# Patient Record
Sex: Female | Born: 1935 | Race: White | Hispanic: No | Smoking: Former smoker
Health system: Southern US, Community
[De-identification: ages and names within clinical notes are randomized; demographics above are authoritative.]

## PROBLEM LIST (undated history)

## (undated) DIAGNOSIS — K219 Gastro-esophageal reflux disease without esophagitis: Secondary | ICD-10-CM

## (undated) DIAGNOSIS — I4891 Unspecified atrial fibrillation: Secondary | ICD-10-CM

## (undated) DIAGNOSIS — E785 Hyperlipidemia, unspecified: Secondary | ICD-10-CM

## (undated) DIAGNOSIS — K5792 Diverticulitis of intestine, part unspecified, without perforation or abscess without bleeding: Secondary | ICD-10-CM

## (undated) DIAGNOSIS — I10 Essential (primary) hypertension: Secondary | ICD-10-CM

## (undated) DIAGNOSIS — I639 Cerebral infarction, unspecified: Secondary | ICD-10-CM

## (undated) DIAGNOSIS — F0390 Unspecified dementia without behavioral disturbance: Secondary | ICD-10-CM

## (undated) HISTORY — PX: KNEE ARTHROPLASTY: SHX992

## (undated) HISTORY — PX: HEMORROIDECTOMY: SUR656

## (undated) HISTORY — PX: CARPAL TUNNEL RELEASE: SHX101

## (undated) HISTORY — PX: CATARACT EXTRACTION, BILATERAL: SHX1313

---

## 2011-08-26 DEATH — deceased

## 2018-04-17 ENCOUNTER — Observation Stay (HOSPITAL_BASED_OUTPATIENT_CLINIC_OR_DEPARTMENT_OTHER)
Admission: EM | Admit: 2018-04-17 | Discharge: 2018-04-19 | Disposition: A | Discharge status: Home / Self Care | Payer: Medicare Other | Source: Home / Self Care | Attending: Emergency Medicine | Admitting: Emergency Medicine

## 2018-04-17 ENCOUNTER — Other Ambulatory Visit: Payer: Self-pay

## 2018-04-17 DIAGNOSIS — Z79899 Other long term (current) drug therapy: Secondary | ICD-10-CM

## 2018-04-17 DIAGNOSIS — I342 Nonrheumatic mitral (valve) stenosis: Secondary | ICD-10-CM

## 2018-04-17 DIAGNOSIS — Z91040 Latex allergy status: Secondary | ICD-10-CM

## 2018-04-17 DIAGNOSIS — Z7982 Long term (current) use of aspirin: Secondary | ICD-10-CM

## 2018-04-17 DIAGNOSIS — E785 Hyperlipidemia, unspecified: Secondary | ICD-10-CM

## 2018-04-17 DIAGNOSIS — I10 Essential (primary) hypertension: Secondary | ICD-10-CM | POA: Insufficient documentation

## 2018-04-17 DIAGNOSIS — Z7902 Long term (current) use of antithrombotics/antiplatelets: Secondary | ICD-10-CM

## 2018-04-17 DIAGNOSIS — I63511 Cerebral infarction due to unspecified occlusion or stenosis of right middle cerebral artery: Secondary | ICD-10-CM

## 2018-04-17 DIAGNOSIS — I639 Cerebral infarction, unspecified: Secondary | ICD-10-CM | POA: Diagnosis present

## 2018-04-17 DIAGNOSIS — E7800 Pure hypercholesterolemia, unspecified: Secondary | ICD-10-CM

## 2018-04-17 DIAGNOSIS — I63411 Cerebral infarction due to embolism of right middle cerebral artery: Secondary | ICD-10-CM | POA: Diagnosis not present

## 2018-04-17 DIAGNOSIS — I4891 Unspecified atrial fibrillation: Secondary | ICD-10-CM | POA: Diagnosis not present

## 2018-04-17 DIAGNOSIS — R911 Solitary pulmonary nodule: Secondary | ICD-10-CM

## 2018-04-17 LAB — DIFFERENTIAL
ABS IMMATURE GRANULOCYTES: 0 10*3/uL (ref 0.0–0.1)
Basophils Absolute: 0 10*3/uL (ref 0.0–0.1)
Basophils Relative: 0 %
Eosinophils Absolute: 0.2 10*3/uL (ref 0.0–0.7)
Eosinophils Relative: 2 %
Immature Granulocytes: 0 %
LYMPHS ABS: 3.4 10*3/uL (ref 0.7–4.0)
LYMPHS PCT: 34 %
MONOS PCT: 9 %
Monocytes Absolute: 0.9 10*3/uL (ref 0.1–1.0)
NEUTROS ABS: 5.4 10*3/uL (ref 1.7–7.7)
Neutrophils Relative %: 55 %

## 2018-04-17 LAB — CBC
HEMATOCRIT: 39.1 % (ref 36.0–46.0)
HEMOGLOBIN: 12.5 g/dL (ref 12.0–15.0)
MCH: 29.4 pg (ref 26.0–34.0)
MCHC: 32 g/dL (ref 30.0–36.0)
MCV: 92 fL (ref 78.0–100.0)
PLATELETS: 207 10*3/uL (ref 150–400)
RBC: 4.25 MIL/uL (ref 3.87–5.11)
RDW: 12.6 % (ref 11.5–15.5)
WBC: 10 10*3/uL (ref 4.0–10.5)

## 2018-04-17 LAB — I-STAT CHEM 8, ED
BUN: 15 mg/dL (ref 8–23)
CHLORIDE: 107 mmol/L (ref 98–111)
CREATININE: 0.8 mg/dL (ref 0.44–1.00)
Calcium, Ion: 1.07 mmol/L — ABNORMAL LOW (ref 1.15–1.40)
GLUCOSE: 107 mg/dL — AB (ref 70–99)
HCT: 37 % (ref 36.0–46.0)
Hemoglobin: 12.6 g/dL (ref 12.0–15.0)
POTASSIUM: 4.2 mmol/L (ref 3.5–5.1)
Sodium: 139 mmol/L (ref 135–145)
TCO2: 24 mmol/L (ref 22–32)

## 2018-04-17 NOTE — ED Triage Notes (Signed)
Patient's daughter c/o her mother having "dizziness and stumbln', she has had the TIAs before".

## 2018-04-18 ENCOUNTER — Emergency Department (HOSPITAL_COMMUNITY): Payer: Medicare Other

## 2018-04-18 ENCOUNTER — Observation Stay (HOSPITAL_COMMUNITY): Payer: Medicare Other

## 2018-04-18 ENCOUNTER — Observation Stay (HOSPITAL_BASED_OUTPATIENT_CLINIC_OR_DEPARTMENT_OTHER): Payer: Medicare Other

## 2018-04-18 ENCOUNTER — Encounter (HOSPITAL_COMMUNITY): Payer: Self-pay

## 2018-04-18 DIAGNOSIS — I639 Cerebral infarction, unspecified: Secondary | ICD-10-CM | POA: Diagnosis present

## 2018-04-18 DIAGNOSIS — Z8249 Family history of ischemic heart disease and other diseases of the circulatory system: Secondary | ICD-10-CM

## 2018-04-18 DIAGNOSIS — Z66 Do not resuscitate: Secondary | ICD-10-CM

## 2018-04-18 DIAGNOSIS — I340 Nonrheumatic mitral (valve) insufficiency: Secondary | ICD-10-CM | POA: Diagnosis not present

## 2018-04-18 DIAGNOSIS — Z8673 Personal history of transient ischemic attack (TIA), and cerebral infarction without residual deficits: Secondary | ICD-10-CM

## 2018-04-18 DIAGNOSIS — I63511 Cerebral infarction due to unspecified occlusion or stenosis of right middle cerebral artery: Secondary | ICD-10-CM | POA: Diagnosis not present

## 2018-04-18 DIAGNOSIS — I10 Essential (primary) hypertension: Secondary | ICD-10-CM

## 2018-04-18 DIAGNOSIS — E7800 Pure hypercholesterolemia, unspecified: Secondary | ICD-10-CM

## 2018-04-18 DIAGNOSIS — E785 Hyperlipidemia, unspecified: Secondary | ICD-10-CM

## 2018-04-18 DIAGNOSIS — Z7982 Long term (current) use of aspirin: Secondary | ICD-10-CM

## 2018-04-18 DIAGNOSIS — Z823 Family history of stroke: Secondary | ICD-10-CM

## 2018-04-18 DIAGNOSIS — Z7902 Long term (current) use of antithrombotics/antiplatelets: Secondary | ICD-10-CM

## 2018-04-18 DIAGNOSIS — Z91040 Latex allergy status: Secondary | ICD-10-CM

## 2018-04-18 DIAGNOSIS — Z79899 Other long term (current) drug therapy: Secondary | ICD-10-CM

## 2018-04-18 LAB — CBC
HCT: 38.5 % (ref 36.0–46.0)
Hemoglobin: 12.6 g/dL (ref 12.0–15.0)
MCH: 30.3 pg (ref 26.0–34.0)
MCHC: 32.7 g/dL (ref 30.0–36.0)
MCV: 92.5 fL (ref 78.0–100.0)
PLATELETS: 199 10*3/uL (ref 150–400)
RBC: 4.16 MIL/uL (ref 3.87–5.11)
RDW: 12.8 % (ref 11.5–15.5)
WBC: 9.6 10*3/uL (ref 4.0–10.5)

## 2018-04-18 LAB — COMPREHENSIVE METABOLIC PANEL
ALT: 14 U/L (ref 0–44)
ANION GAP: 9 (ref 5–15)
AST: 21 U/L (ref 15–41)
Albumin: 3.5 g/dL (ref 3.5–5.0)
Alkaline Phosphatase: 52 U/L (ref 38–126)
BILIRUBIN TOTAL: 0.6 mg/dL (ref 0.3–1.2)
BUN: 13 mg/dL (ref 8–23)
CHLORIDE: 107 mmol/L (ref 98–111)
CO2: 24 mmol/L (ref 22–32)
Calcium: 9.1 mg/dL (ref 8.9–10.3)
Creatinine, Ser: 0.78 mg/dL (ref 0.44–1.00)
GFR calc Af Amer: >60 mL/min (ref >60)
GFR calc non Af Amer: >60 mL/min (ref >60)
GLUCOSE: 109 mg/dL — AB (ref 70–99)
POTASSIUM: 4.3 mmol/L (ref 3.5–5.1)
SODIUM: 140 mmol/L (ref 135–145)
TOTAL PROTEIN: 6.6 g/dL (ref 6.5–8.1)

## 2018-04-18 LAB — APTT: APTT: 32 s (ref 24–36)

## 2018-04-18 LAB — CREATININE, SERUM
Creatinine, Ser: 0.88 mg/dL (ref 0.44–1.00)
GFR calc Af Amer: >60 mL/min (ref >60)
GFR calc non Af Amer: 57 mL/min — ABNORMAL LOW (ref >60)

## 2018-04-18 LAB — ECHOCARDIOGRAM COMPLETE: WEIGHTICAEL: 2432 [oz_av]

## 2018-04-18 LAB — I-STAT TROPONIN, ED: Troponin i, poc: 0.02 ng/mL (ref 0.00–0.08)

## 2018-04-18 LAB — PROTIME-INR
INR: 1.1
PROTHROMBIN TIME: 14.2 s (ref 11.4–15.2)

## 2018-04-18 LAB — HEMOGLOBIN A1C
HEMOGLOBIN A1C: 5.8 % — AB (ref 4.8–5.6)
Mean Plasma Glucose: 119.76 mg/dL

## 2018-04-18 LAB — LIPID PANEL
CHOL/HDL RATIO: 5.1 ratio
Cholesterol: 244 mg/dL — ABNORMAL HIGH (ref 0–200)
HDL: 48 mg/dL (ref >40)
LDL CALC: 164 mg/dL — AB (ref 0–99)
Triglycerides: 161 mg/dL — ABNORMAL HIGH (ref <150)
VLDL: 32 mg/dL (ref 0–40)

## 2018-04-18 MED ORDER — ASPIRIN EC 81 MG PO TBEC: 81.0000 mg | DELAYED_RELEASE_TABLET | Freq: Every day | ORAL | Status: DC

## 2018-04-18 MED ORDER — ENOXAPARIN SODIUM 40 MG/0.4ML ~~LOC~~ SOLN
40.0000 mg | SUBCUTANEOUS | Status: DC
  Administered 2018-04-18 – 2018-04-19 (×2): 40 mg via SUBCUTANEOUS
  Filled 2018-04-18 (×3): qty 0.4

## 2018-04-18 MED ORDER — IOPAMIDOL (ISOVUE-370) INJECTION 76%
50.0000 mL | Freq: Once | INTRAVENOUS | Status: AC | PRN
  Administered 2018-04-18: 50 mL via INTRAVENOUS

## 2018-04-18 MED ORDER — ASPIRIN EC 325 MG PO TBEC
325.0000 mg | DELAYED_RELEASE_TABLET | Freq: Every day | ORAL | Status: DC
  Administered 2018-04-18 – 2018-04-19 (×2): 325 mg via ORAL
  Filled 2018-04-18 (×2): qty 1

## 2018-04-18 MED ORDER — SENNOSIDES-DOCUSATE SODIUM 8.6-50 MG PO TABS: 1.0000 | ORAL_TABLET | Freq: Every evening | ORAL | Status: DC | PRN

## 2018-04-18 MED ORDER — LACTATED RINGERS IV BOLUS
1000.0000 mL | Freq: Once | INTRAVENOUS | Status: AC
  Administered 2018-04-18: 1000 mL via INTRAVENOUS

## 2018-04-18 MED ORDER — FAMOTIDINE 20 MG PO TABS
20.0000 mg | ORAL_TABLET | Freq: Every day | ORAL | Status: DC
  Administered 2018-04-18 – 2018-04-19 (×2): 20 mg via ORAL
  Filled 2018-04-18 (×2): qty 1

## 2018-04-18 MED ORDER — ATORVASTATIN CALCIUM 40 MG PO TABS: 40.0000 mg | ORAL_TABLET | Freq: Every day | ORAL | Status: DC

## 2018-04-18 MED ORDER — CLOPIDOGREL BISULFATE 75 MG PO TABS
75.0000 mg | ORAL_TABLET | Freq: Every day | ORAL | Status: DC
  Administered 2018-04-18 – 2018-04-19 (×2): 75 mg via ORAL
  Filled 2018-04-18 (×2): qty 1

## 2018-04-18 MED ORDER — MECLIZINE HCL 12.5 MG PO TABS: 25.0000 mg | ORAL_TABLET | Freq: Three times a day (TID) | ORAL | Status: DC | PRN

## 2018-04-18 MED ORDER — ACETAMINOPHEN 325 MG PO TABS: 650.0000 mg | ORAL_TABLET | Freq: Four times a day (QID) | ORAL | Status: DC | PRN

## 2018-04-18 MED ORDER — ASPIRIN 81 MG PO CHEW
324.0000 mg | CHEWABLE_TABLET | Freq: Once | ORAL | Status: AC
  Administered 2018-04-18: 324 mg via ORAL
  Filled 2018-04-18: qty 4

## 2018-04-18 MED ORDER — ACETAMINOPHEN 650 MG RE SUPP: 650.0000 mg | Freq: Four times a day (QID) | RECTAL | Status: DC | PRN

## 2018-04-18 MED ORDER — EZETIMIBE 10 MG PO TABS
10.0000 mg | ORAL_TABLET | Freq: Every day | ORAL | Status: DC
  Administered 2018-04-18 – 2018-04-19 (×2): 10 mg via ORAL
  Filled 2018-04-18 (×2): qty 1

## 2018-04-18 NOTE — Evaluation (Signed)
Occupational Therapy Evaluation Patient Details Name: Katelyn Mckinney MRN: [id number] DOB: 1926-05-15 Today's Date: 04/18/2018    History of Present Illness Pt is an 84 y.o. female with PMH significant for previous TIAs, hypertension, hyperlipidemia, and vertigo who presented to the ED with dysarthria and dizziness with gait disturbance that resolved. CTA revealed R M1 near occlusion.   Clinical Impression   PTA, pt was living alone and was independent with basic ADL and majority of IADL tasks. Daughter checks in on pt regularly. Pt currently requiring min guard assist for toilet transfers and min assist for LB ADL. She demonstrates generalized weakness, slight confusion, and decreased problem solving skills. She was not oriented to the day of the week or why she is in the hospital. Pt would benefit from 24 hour assistance at home to maximize safety as well as home health OT follow-up post-acute D/C. Daughter reports that she will be able to provide temporary 24 hour assistance post-acute D/C.     Follow Up Recommendations  Home health OT;Supervision/Assistance - 24 hour    Equipment Recommendations  3 in 1 bedside commode    Recommendations for Other Services       Precautions / Restrictions Precautions Precautions: Fall Restrictions Weight Bearing Restrictions: No      Mobility Bed Mobility Overal bed mobility: Needs Assistance Bed Mobility: Supine to Sit     Supine to sit: Min assist     General bed mobility comments: Min assist at trunk to achieve seated position at EOB.   Transfers Overall transfer level: Needs assistance Equipment used: None Transfers: Sit to/from Stand Sit to Stand: Min guard         General transfer comment: Guarding assist to power up to standing.     Balance Overall balance assessment: Mild deficits observed, not formally tested                                         ADL either performed or assessed with clinical  judgement   ADL Overall ADL's : Needs assistance/impaired Eating/Feeding: Set up;Sitting   Grooming: Set up;Sitting   Upper Body Bathing: Set up;Sitting   Lower Body Bathing: Minimal assistance;Sit to/from stand   Upper Body Dressing : Set up;Sitting   Lower Body Dressing: Minimal assistance;Sit to/from stand   Toilet Transfer: Min guard;Ambulation   Toileting- Clothing Manipulation and Hygiene: Min guard;Sit to/from stand       Functional mobility during ADLs: Min guard General ADL Comments: Pt requiring cues for processing and sequencing. Min assist to address LB dressing and bathing tasks.      Vision Patient Visual Report: No change from baseline Vision Assessment?: No apparent visual deficits     Perception     Praxis      Pertinent Vitals/Pain Pain Assessment: No/denies pain     Hand Dominance Right   Extremity/Trunk Assessment Upper Extremity Assessment Upper Extremity Assessment: Generalized weakness   Lower Extremity Assessment Lower Extremity Assessment: Generalized weakness       Communication Communication Communication: No difficulties   Cognition Arousal/Alertness: Awake/alert Behavior During Therapy: WFL for tasks assessed/performed Overall Cognitive Status: Impaired/Different from baseline Area of Impairment: Orientation;Problem solving                 Orientation Level: Disoriented to;Situation           Problem Solving: Slow processing;Difficulty sequencing General Comments: Pt was  asleep on arrival and this may have had an influence on cognition. Pt's daughter reports that this is not typical.    General Comments  Daughter present at end of session.     Exercises     Shoulder Instructions      Home Living Family/patient expects to be discharged to:: Private residence Living Arrangements: Alone Available Help at Discharge: Family;Available 24 hours/day(initially 24 hours a day but then PRN) Type of Home: House Home  Access: Stairs to enter Entergy CorporationEntrance Stairs-Number of Steps: 2 Entrance Stairs-Rails: ("holds on to door frame" per daughter) Home Layout: One level     Bathroom Shower/Tub: Tub/shower unit(takes baths in tub)   Bathroom Toilet: Standard     Home Equipment: Grab bars - tub/shower          Prior Functioning/Environment Level of Independence: Independent        Comments: Independent for basic ADL, medication management, and majority of financial management.         OT Problem List: Decreased strength;Decreased activity tolerance;Decreased range of motion;Impaired balance (sitting and/or standing);Decreased safety awareness;Decreased knowledge of use of DME or AE;Decreased knowledge of precautions;Pain      OT Treatment/Interventions: Self-care/ADL training;Therapeutic exercise;Energy conservation;DME and/or AE instruction;Therapeutic activities;Patient/family education;Balance training    OT Goals(Current goals can be found in the care plan section) Acute Rehab OT Goals Patient Stated Goal: "to get a blanket" OT Goal Formulation: With patient/family Time For Goal Achievement: 05/02/18 Potential to Achieve Goals: Good ADL Goals Pt Will Perform Grooming: with modified independence;standing Pt Will Perform Upper Body Dressing: with modified independence;sitting Pt Will Perform Lower Body Dressing: with modified independence;sit to/from stand Pt Will Transfer to Toilet: with modified independence;ambulating;regular height toilet Pt Will Perform Toileting - Clothing Manipulation and hygiene: with modified independence;sit to/from stand Pt Will Perform Tub/Shower Transfer: Tub transfer;ambulating;3 in 1;grab bars  OT Frequency: Min 2X/week   Barriers to D/C:            Co-evaluation              AM-PAC PT "6 Clicks" Daily Activity     Outcome Measure Help from another person eating meals?: None Help from another person taking care of personal grooming?: None Help from  another person toileting, which includes using toliet, bedpan, or urinal?: A Little Help from another person bathing (including washing, rinsing, drying)?: A Little Help from another person to put on and taking off regular upper body clothing?: None Help from another person to put on and taking off regular lower body clothing?: A Little 6 Click Score: 21   End of Session Equipment Utilized During Treatment: Gait belt Nurse Communication: Mobility status  Activity Tolerance: Patient tolerated treatment well Patient left: in chair;with call bell/phone within reach;with chair alarm set  OT Visit Diagnosis: Other abnormalities of gait and mobility (R26.89);Other symptoms and signs involving cognitive function;Muscle weakness (generalized) (M62.81)                Time: [id number] OT Time Calculation (min): 25 min Charges:  OT General Charges $OT Visit: 1 Visit OT Evaluation $OT Eval Low Complexity: 1 Low OT Treatments $Self Care/Home Management : 8-22 mins  Doristine Sectionharity A Bergen Melle, MS OTR/L  Pager: [phone]   Ariah Mower A Kellyanne Ellwanger 04/18/2018, 8:41 AM

## 2018-04-18 NOTE — Progress Notes (Signed)
  Echocardiogram 2D Echocardiogram has been performed.  Katelyn SavoyCasey N Albion Mckinney 04/18/2018, 2:40 PM

## 2018-04-18 NOTE — ED Notes (Signed)
Ambulated in hall.  Steady gait noted.  Tolerated well.

## 2018-04-18 NOTE — Progress Notes (Signed)
SLP Cancellation Note  Patient Details Name: Katelyn Mckinney MRN: [id number] DOB: 07-Jul-1926   Cancelled treatment:       Reason Eval/Treat Not Completed: Other (comment) pt passed RN stroke swallow screen; per protocol, no SLP swallow eval warranted.  Our services will sign off.    Blenda MountsCouture, Hank Walling Laurice 04/18/2018, 11:01 AM

## 2018-04-18 NOTE — Consult Note (Signed)
Neurology Consultation Reason for Consult: Unsteady gait Referring Physician: Rhunette CroftNanavati, A  CC: Slurred speech  History is obtained from: patient, family  HPI: Katelyn Mckinney is a 84 y.o. female with a history of hypertension, hyperlipidemia presents with unsteady gait dysarthria for the past couple of days.  Her daughter states that on Saturday she began slurring her words and complaining of difficulty walking.  The patient states that she just felt unsteady.  Of note, she has had TIAs in the past and is currently taking Plavix.  She has been on statins in the past, but due to intolerance is not been able to take them.  LKW: Saturday tpa given?: no, outside of window    ROS: A 14 point ROS was performed and is negative except as noted in the HPI.   Past medical history:  Hypertension Hyperlipidemia TIAs Statin intolerance  Family history: Daughter with statin intolerance  Social History: Does not smoke   Exam: Current vital signs: BP (!) 118/97   Pulse 63   Temp 98.4 F (36.9 C)   Resp 16   Wt 68.9 kg   SpO2 100%  Vital signs in last 24 hours: Temp:  [98.4 F (36.9 C)-98.5 F (36.9 C)] 98.4 F (36.9 C) (08/25 0051) Pulse Rate:  [56-75] 63 (08/25 0300) Resp:  [16-23] 16 (08/25 0300) BP: (115-166)/(56-97) 118/97 (08/25 0300) SpO2:  [95 %-100 %] 100 % (08/25 0300) Weight:  [68.9 kg] 68.9 kg (08/24 2343)   Physical Exam  Constitutional: Appears well-developed and well-nourished.  Psych: Affect appropriate to situation Eyes: No scleral injection HENT: No OP obstrucion Head: Normocephalic.  Cardiovascular: Normal rate and regular rhythm.  Respiratory: Effort normal, non-labored breathing GI: Soft.  No distension. There is no tenderness.  Skin: WDI  Neuro: Mental Status: Patient is awake, alert, oriented to person, place, month, year, and situation. Patient is able to give a clear and coherent history. No signs of aphasia  She has mild visual neglect,  when double simultaneous stimulation is presented in the visual field she only counts fingers on the right Cranial Nerves: II: Visual Fields are full. Pupils are equal, round, and reactive to light.   III,IV, VI: EOMI without ptosis or diploplia.  V: Facial sensation is symmetric to temperature VII: Facial movement is symmetric.  VIII: hearing is intact to voice X: Uvula elevates symmetrically XI: Shoulder shrug is symmetric. XII: tongue is midline without atrophy or fasciculations.  Motor: Tone is normal. Bulk is normal. 5/5 strength was present in all four extremities.  Sensory: Sensation is symmetric to light touch and temperature in the arms and legs.  She does not extinguish to double simultaneous stimulation sensory stimuli. Cerebellar: No clear ataxia but she does have intentional tremor on finger-nose-finger bilaterally  I have reviewed labs in epic and the results pertinent to this consultation are: CMP-unremarkable  I have reviewed the images obtained: CT head- no clear stroke, CTA severe M1 stenosis on the right  Impression: 84 year old female with slurred speech and unsteady gait, mild visual neglect with severe right M1 stenosis.  I suspect that she has had a small ischemic infarct that is not showing up well on plain CT.  She will need to be admitted for secondary risk factor modification and physical therapy.  Recommendations: - HgbA1c, fasting lipid panel - MRI of the brain without contrast - Frequent neuro checks - Echocardiogram - Carotid dopplers are not needed given CTA artery performed - Prophylactic therapy-Antiplatelet med: Plavix - Risk factor modification -  Telemetry monitoring - PT consult, OT consult, Speech consult - Stroke team to follow    Roland Rack, MD [facility] [phone]  If 7pm- 7am, please page neurology on call as listed in [place].

## 2018-04-18 NOTE — ED Notes (Signed)
Taken to ct at this time. 

## 2018-04-18 NOTE — Progress Notes (Signed)
Pt admitted to unit; ambulated to bed from stretcher. Welcomed and oriented to unit. No c/o pain or discomfort.

## 2018-04-18 NOTE — Progress Notes (Signed)
   Subjective: Patient is feeling well this AM and in her opinion feels back to baseline. She did walk a little this AM and felt weak in her LEs towards the end of ambulation. No issues tolerating PO intake. Per daughter at bedside she feels that her mother is no longer slurring her words but still seems to be confused. We discussed the plan for continued TIA vs CVA work-up including MRI and echocardiogram. Patient and daughter agree. All questions and concerns addressed.   Objective: Vital signs in last 24 hours: Vitals:   04/18/18 0500 04/18/18 0558 04/18/18 0625 04/18/18 0829  BP: (!) 152/77  (!) 171/65 (!) 147/66  Pulse: 75  74 68  Resp: 15  16 16   Temp:  98.5 F (36.9 C) 97.9 F (36.6 C) 98.5 F (36.9 C)  TempSrc:  Oral Oral Oral  SpO2: 99%  98% 98%  Weight:       General: Well nourished female in no acute distress Pulm: Good air movement with no wheezing or crackles  CV: RRR, no murmurs, no rubs  Abdomen: Active bowel sounds, soft, non-distended, no tenderness to palpation  Neuro: Alert. Oriented to person and place but not time. Cranial nerves intact bilaterally. Gross strength 5/5 in the upper and lower extremities bilaterally.   Assessment/Plan:  [name] is a 84 y.o female with prior CVA, HTN, and HLD who presented with <24 hours of slurred speech, confusion, and gait ataxia. She is admitted for continued CVA vs TIA work-up.   CVA vs TIA - Patient still slightly confused but no longer slurring her words. No other neuro deficits noted.  - CT without any acute ischemia. CTA with right M1 segment near occlusion. - LDL 164, not on statin because she was intolerant in the past. Will start Ezetimibe 10 mg QD - A1c 5.8 % in the pre-diabetic range, outpatient follow-up  - Telemetry reviewed. Sinus without any atrial fibrillation or flutter  - Continue ASA and clopidogrel  - Allow permissive HTN  - MRI ordered/pending  - Echocardiogram ordered/pending  - PT eval pending. OT  recommending home health OT and 3-in-1 bedside commode  - Appreciate neurology recs.   Hypertension  - Allowing permissive HTN in the setting of possible CVA - On Atenolol 100 mg QD at home  Hyperlipidemia  - LDL 162 - Statin intolerant. Started on Ezetimibe 10 mg QD  RUL Pulmonary Nodule  - CTA head and neck with incidental 4 mm RUL nodule  - Former social smoker  - Outpatient follow-up with her PCP  Dispo: Anticipated discharge in 0-1 days depending on conclusion of CVA/TIA work-up and neurology recommendations.   Levora DredgeHelberg, Manasvini Whatley, MD 04/18/2018, 9:43 AM Pager: [phone]

## 2018-04-18 NOTE — Progress Notes (Signed)
STROKE TEAM PROGRESS NOTE   SUBJECTIVE (INTERVAL HISTORY) Her daughter and son-in-law is at the bedside.  Patient is sitting in bed without acute abnormality.  Patient daughter said patient speech and generalized weakness much improved from yesterday.  Neuro examination nonfocal.    OBJECTIVE Vitals:   04/18/18 0558 04/18/18 0625 04/18/18 0829 04/18/18 1527  BP:  (!) 171/65 (!) 147/66 (!) 146/113  Pulse:  74 68   Resp:  16 16 19   Temp: 98.5 F (36.9 C) 97.9 F (36.6 C) 98.5 F (36.9 C) 98.2 F (36.8 C)  TempSrc: Oral Oral Oral Oral  SpO2:  98% 98% 98%  Weight:        CBC:  Recent Labs  Lab 04/17/18 2348 04/17/18 2354 04/18/18 0522  WBC 10.0  --  9.6  NEUTROABS 5.4  --   --   HGB 12.5 12.6 12.6  HCT 39.1 37.0 38.5  MCV 92.0  --  92.5  PLT 207  --  199    Basic Metabolic Panel:  Recent Labs  Lab 04/17/18 2348 04/17/18 2354 04/18/18 0522  NA 140 139  --   K 4.3 4.2  --   CL 107 107  --   CO2 24  --   --   GLUCOSE 109* 107*  --   BUN 13 15  --   CREATININE 0.78 0.80 0.88  CALCIUM 9.1  --   --     Lipid Panel:     Component Value Date/Time   CHOL 244 (H) 04/18/2018 0522   TRIG 161 (H) 04/18/2018 0522   HDL 48 04/18/2018 0522   CHOLHDL 5.1 04/18/2018 0522   VLDL 32 04/18/2018 0522   LDLCALC 164 (H) 04/18/2018 0522   HgbA1c:  Lab Results  Component Value Date   HGBA1C 5.8 (H) 04/18/2018   Urine Drug Screen: No results found for: LABOPIA, COCAINSCRNUR, LABBENZ, AMPHETMU, THCU, LABBARB  Alcohol Level No results found for: ETH  IMAGING  Ct Angio Head W Or Wo Contrast Ct Angio Neck W And/or Wo Contrast 04/18/2018 IMPRESSION:  1. Right M1 6 mm length segment of near occlusion, severe thread-like stenosis. Diminished downstream right MCA distribution in comparison with the left.  CT perfusion of the head may be useful to assess for oligemia.  2. Calcific atherosclerosis of the aorta, carotid arteries, and vertebral arteries. Multiple segments of mild  stenosis in the anterior and posterior circulations.  3. No additional large vessel occlusion, hemodynamically significant stenosis by NASCET criteria, dissection, or vascular malformation.  4. 4 mm right upper lobe pulmonary nodule. No follow-up needed if patient is low-risk. Non-contrast chest CT can be considered in 12 months if patient is high-risk. This recommendation follows the consensus statement: Guidelines for Management of Incidental Pulmonary Nodules Detected on CT Images: From the Fleischner Society 2017; Radiology 2017; 284:228-243.  5. Trace bilateral pleural effusions and mild interstitial pulmonary edema.   Ct Head Wo Contrast 04/18/2018 IMPRESSION:  1. No acute intracranial abnormality identified.  2. Mild chronic microvascular ischemic changes and volume loss of the brain for age. Small chronic infarct within the left anterior thalamus.   MRI Brain Wo Contrast - pending   TTE  - Left ventricle: The cavity size was normal. Wall thickness was   normal. Systolic function was normal. The estimated ejection   fraction was in the range of 60% to 65%. The study is not   technically sufficient to allow evaluation of LV diastolic   function. - Aortic valve: Sclerosis  without stenosis. - Mitral valve: Severely calcified annulus. Moderately thickened,   moderately calcified leaflets . The findings are consistent with   mild stenosis. There was mild regurgitation. Valve area by   continuity equation (using LVOT flow): 1 cm^2. - Left atrium: The atrium was moderately to severely dilated. - Atrial septum: No defect or patent foramen ovale was identified. - Pulmonary arteries: PA peak pressure: 43 mm Hg (S).  Bilateral Lower Extremity Dopplers Right: There is no evidence of deep vein thrombosis in the lower extremity.There is no evidence of superficial venous thrombosis. Left: There is no evidence of deep vein thrombosis in the lower extremity.There is no evidence of superficial  venous thrombosis.     PHYSICAL EXAM Temp:  [97.9 F (36.6 C)-98.5 F (36.9 C)] 98.2 F (36.8 C) (08/25 1527) Pulse Rate:  [56-75] 68 (08/25 0829) Resp:  [12-23] 19 (08/25 1527) BP: (115-171)/(52-113) 146/113 (08/25 1527) SpO2:  [95 %-100 %] 98 % (08/25 1527) Weight:  [68.9 kg] 68.9 kg (08/25 1240)  General - Well nourished, well developed, in no apparent distress.  Ophthalmologic - fundi not visualized due to noncooperation.  Cardiovascular - Regular rate and rhythm with no murmur.  Mental Status -  Level of arousal and orientation to place, and person were intact, but incorrect on month and year. Language including expression, naming, repetition, comprehension was assessed and found intact.  Cranial Nerves II - XII - II - Visual field intact OU. III, IV, VI - Extraocular movements intact. V - Facial sensation intact bilaterally. VII - Facial movement intact bilaterally. VIII - Hearing & vestibular intact bilaterally. X - Palate elevates symmetrically. XI - Chin turning & shoulder shrug intact bilaterally. XII - Tongue protrusion intact.  Motor Strength - The patient's strength was symmetrical in all extremities and pronator drift was absent.  Bulk was normal and fasciculations were absent.   Motor Tone - Muscle tone was assessed at the neck and appendages and was normal.  Reflexes - The patient's reflexes were symmetrical in all extremities and she had no pathological reflexes.  Sensory - Light touch, temperature/pinprick were assessed and were symmetrical.    Coordination - The patient had normal movements in the hands with no ataxia or dysmetria. Mild action tremor was present b/l UEs.  Gait and Station - deferred.   ASSESSMENT/PLAN [name] is a 84 y.o. female with history of TIAs, hypertension, and hyperlipidemia presenting with speech and gait difficulties. She did not receive IV t-PA due to late presentation  Stroke vs. TIA:  MRI pending - may  related to right M1 high grade stenosis  CT head - No acute intracranial abnormality identified.   MRI head - pending  CTA H&N - Right M1 6 mm length segment of near occlusion, severe thread-like stenosis.  Aortic atherosclerosis, right VA origin stenosis and left ICA 50% proximal stenosis.  B LE Dopplers no DVT  2D Echo EF 60 to 65%  LDL - 164  HgbA1c - 5.8  VTE prophylaxis - Lovenox  aspirin 81 mg daily and clopidogrel 75 mg daily prior to admission, now on aspirin 325 mg daily and clopidogrel 75 mg daily due to intracranial stenosis.  Recommend DAPT for 3 months and then Plavix alone.  Patient counseled to be compliant with her antithrombotic medications  Ongoing aggressive stroke risk factor management  Therapy recommendations:  HH PT and OT recommended.  Disposition:  Pending  Hypertension  Stable . Permissive hypertension (OK if < 220/120) but gradually normalize  in 5-7 days . Long-term BP goal normotensive  Hyperlipidemia  Lipid lowering medication PTA:  Statin intolerance  LDL 164, goal < 70  Current lipid lowering medication: Zetia 10 mg daily  Continue statin at discharge  Consider PCSK9 inhibitors as outpatient  Other Stroke Risk Factors  Advanced age  Obesity  Hx of "TIAs" with episode of generalized weakness and near syncope  Family history of stroke - per daughter's report  Other Active Problems  4 mm right upper lobe pulmonary nodule on CTA.  Statin intolerance   Hospital day # 0  Marvel Plan, MD PhD Stroke Neurology 04/18/2018 6:14 PM    To contact Stroke Continuity provider, please refer to [email or web address]. After hours, contact General Neurology

## 2018-04-18 NOTE — ED Notes (Signed)
Pt ambulated to bathroom with minimal assistance. Slight wobbliness noted but pt ambulated independently most of the walk.

## 2018-04-18 NOTE — H&P (Addendum)
Date: 04/18/2018               Patient Name:  TAYIA STONESIFER MRN: [id number]  DOB: 09-17-1925 Age / Sex: 84 y.o., female   PCP: Aniceto Boss, MD         Medical Service: Internal Medicine Teaching Service         Attending Physician: Dr. Earl Lagos, MD    First Contact: Dr. Minus Liberty Pager: [phone]  Second Contact: Dr. Caron Presume Pager: [phone]       After Hours (After 5p/  First Contact Pager: [phone]  weekends / holidays): Second Contact Pager: [phone]   Chief Complaint: Dizziness  History of Present Illness:   Ms. Sabree is an 84 year old woman with medical history significant for previous TIAs, hypertension, hyperlipidemia and vertigo who presented to the ED with dysarthria and dizziness which had resolved on arrival to the ED.  Per her daughter and brother-in-law, she was in her usual state of health until Saturday evening around 7 p.m.  The daughter was on the phone with mom and had noticed her mom was having slurred speech and was confused as she had asked about going to church.  After paying visit to see her mom, she realized that she had an unsteady gait. On speaking to the patient, she reported of generalized weakness for 1 week and lightheadedness with standing but denies loss of consciousness, falls (however daughter reported she had complained of falls previously), syncopal episodes, palpitation, chest pain, shortness of breath, headaches, head trauma. Currently, she complains of a 9/10 ache at the back of her neck which she indicates as   chronic and typically relieved with Tylenol.  She is very active, lives by herself in [place], able to complete all her ADLs and has frequent visits from her family.    She reports of having 3-4 TIAs in the past couple years and has been evaluated at the hospital in [place].  She also experiences occasional vertigo with sensation of the room spinning especially with head movement but this recent dizziness did not resemble her  usual vertigo spells.  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  She received a one-time dose of aspirin 324 mg and 1 L bolus of lactated Ringer's.  Neurology and internal medicine team were consulted for further evaluation.  Meds:  Current Meds  Medication Sig  . aspirin EC 81 MG tablet Take 81 mg by mouth daily.  Marland Kitchen atenolol (TENORMIN) 50 MG tablet Take 100 mg by mouth daily.  . clopidogrel (PLAVIX) 75 MG tablet Take 75 mg by mouth daily.  Marland Kitchen LORazepam (ATIVAN) 0.5 MG tablet Take 0.5 mg by mouth at bedtime.  . meclizine (ANTIVERT) 25 MG tablet Take 25 mg by mouth 3 (three) times daily as needed for dizziness.   . ranitidine (ZANTAC) 75 MG tablet Take 75 mg by mouth daily as needed for heartburn.     Allergies: Allergies as of 04/17/2018 - Review Complete 04/17/2018  Allergen Reaction Noted  . Latex  04/17/2018   No past medical history on file.  Family History:  -Hypertension and stroke - Daughter reports allergy/intolerance to  statin  Social History:  - Denies current cigarette, EtOH, illicit drug use   Review of Systems: A complete ROS was negative except as per HPI.   Physical Exam: Blood pressure 118/71, pulse 69, temperature 98.4 F (36.9 C), resp. rate (!) 21, weight 68.9 kg, SpO2 95 %.  Physical Exam  Constitutional: She is well-developed, well-nourished, and in no distress.  HENT:  Head: Normocephalic and atraumatic.    Eyes: Pupils are equal, round, and reactive to light. EOM are normal.  Neck: Normal range of motion. Neck supple.  Cardiovascular: Normal  rate, regular rhythm, normal heart sounds and intact distal pulses. Exam reveals no gallop and no friction rub.  No murmur heard. Pulmonary/Chest: Effort normal and breath sounds normal. No respiratory distress. She has no wheezes. She has no rales.  Abdominal: Soft. Bowel sounds are normal. She exhibits no distension. There is no tenderness.  Neurological: She is alert. She has normal motor skills. No cranial nerve deficit.  Alert and oriented to only name and place CN II-XII intact Cerebella: No dysdiadochokinesia  Skin:     Psychiatric: Mood and affect normal.    EKG: personally reviewed my interpretation is unremarkable EKG   Assessment & Plan by Problem: Active Problems:   TIA (transient ischemic attack)  Ms. Suzette BattiestBowling is an 84 year old woman with medical history significant for previous TIAs, hypertension, hyperlipidemia and vertigo who presented to the ED with less than 24-hour history of dysarthria and dizziness currently being managed for TIA.  Transient ischemic attack: Patient with <24hr with history of dysarthria, dizziness. CT head showed no acute intracranial abnormality.  CT Angio showed right M1 segment near occlusion.  No neurological deficits on physical exam but found to be only alert and oriented to name and place.  Her risk factors for TIA and cerebrovascular accident include personal history of TIAs, hypertension, hyperlipidemia.  Currently not on statin therapy due to intolerance, reports of having swelling and weakness while on statin.  Please appreciate neurology recs: -Follow-up hemoglobin A1c, fasting lipid panel, MRI brain without contrast, echocardiogram -Telemetry monitoring -Frequent neurochecks -Continue Plavix 75mg , ASA 81mg   -PT/OT/speech evaluation consult  Hypertension: Current BP of 118/71.  Takes atenolol 100 mg daily at home. -Hold antihypertensives to allow cerebral perfusion  Hyperlipidemia: Reported history of statin intolerance, unable to  calculate ASCVD score at this point as we do not have lipid panel on file.  - Continue aspirin 81 mg daily, Plavix 25 mg daily  Vertigo: We will continue home meclizine 25 mg 3 times daily prn dizziness   FEN: No fluids, replace electrolytes as needed, n.p.o. VTE prophylaxis: Lovenox 40 mg SQ CODE STATUS: DNR  Dispo: Admit patient to observation with expected length of stay less than 2 midnights    Signed: Yvette RackAgyei, Treesa Mccully K, MD 04/18/2018, 5:26 AM  Pager: [phone] [facility] PGY-1

## 2018-04-18 NOTE — Evaluation (Signed)
Physical Therapy Evaluation Patient Details Name: Katelyn Mckinney MRN: [id number] DOB: 06/22/1926 Today's Date: 04/18/2018   History of Present Illness  Pt is an 84 y.o. female with PMH significant for previous TIAs, hypertension, hyperlipidemia, and vertigo who presented to the ED with dysarthria and dizziness with gait disturbance that resolved. CTA revealed R M1 near occlusion.  Clinical Impression  Orders received for PT evaluation. Patient demonstrates deficits in functional mobility as indicated below. Will benefit from continued skilled PT to address deficits and maximize function. Will see as indicated and progress as tolerated.  OF note; Patient does endorse dizziness with activity which resolves quickly and pain in posterior neck.    Follow Up Recommendations Home health PT;Supervision/Assistance - 24 hour    Equipment Recommendations  None recommended by PT    Recommendations for Other Services       Precautions / Restrictions Precautions Precautions: Fall Restrictions Weight Bearing Restrictions: No      Mobility  Bed Mobility Overal bed mobility: Needs Assistance Bed Mobility: Supine to Sit     Supine to sit: Min assist     General bed mobility comments: received in chair   Transfers Overall transfer level: Needs assistance Equipment used: None Transfers: Sit to/from Stand Sit to Stand: Min guard         General transfer comment: Guarding assist to power up to standing.   Ambulation/Gait Ambulation/Gait assistance: Min guard Gait Distance (Feet): 160 Feet Assistive device: None Gait Pattern/deviations: Step-through pattern;Decreased stride length;Shuffle Gait velocity: decreased Gait velocity interpretation: <1.8 ft/sec, indicate of risk for recurrent falls General Gait Details: patient reports bilateral LE tightness and generalized weakness/fatigue with mobility. some instability noted but no physical assist required  Stairs             Wheelchair Mobility    Modified Rankin (Stroke Patients Only) Modified Rankin (Stroke Patients Only) Pre-Morbid Rankin Score: No symptoms Modified Rankin: Moderate disability     Balance Overall balance assessment: Mild deficits observed, not formally tested                                           Pertinent Vitals/Pain Pain Assessment: Faces Faces Pain Scale: Hurts even more Pain Location: neck Pain Descriptors / Indicators: Aching Pain Intervention(s): Monitored during session    Home Living Family/patient expects to be discharged to:: Private residence Living Arrangements: Alone Available Help at Discharge: Family;Available 24 hours/day(initially 24 hours a day but then PRN) Type of Home: House Home Access: Stairs to enter Entrance Stairs-Rails: ("holds on to door frame" per daughter) Secretary/administratorntrance Stairs-Number of Steps: 2 Home Layout: One level Home Equipment: Grab bars - tub/shower      Prior Function Level of Independence: Independent         Comments: Independent for basic ADL, medication management, and majority of financial management.      Hand Dominance   Dominant Hand: Right    Extremity/Trunk Assessment   Upper Extremity Assessment Upper Extremity Assessment: Generalized weakness    Lower Extremity Assessment Lower Extremity Assessment: Generalized weakness       Communication   Communication: No difficulties  Cognition Arousal/Alertness: Awake/alert Behavior During Therapy: WFL for tasks assessed/performed Overall Cognitive Status: Impaired/Different from baseline Area of Impairment: Attention;Memory;Problem solving                 Orientation Level: Disoriented to;Situation Current Attention Level:  Sustained Memory: Decreased short-term memory       Problem Solving: Slow processing;Difficulty sequencing General Comments: Pt was asleep on arrival and this may have had an influence on cognition. Pt's  daughter reports that this is not typical.       General Comments General comments (skin integrity, edema, etc.): daughter at bedside during session    Exercises     Assessment/Plan    PT Assessment Patient needs continued PT services  PT Problem List Decreased strength;Decreased activity tolerance;Decreased balance;Decreased mobility;Pain;Decreased cognition       PT Treatment Interventions DME instruction;Gait training;Stair training;Functional mobility training;Therapeutic activities;Therapeutic exercise;Neuromuscular re-education;Balance training;Patient/family education    PT Goals (Current goals can be found in the Care Plan section)  Acute Rehab PT Goals Patient Stated Goal: to take a nap PT Goal Formulation: With patient/family Time For Goal Achievement: 05/02/18 Potential to Achieve Goals: Good    Frequency Min 3X/week   Barriers to discharge        Co-evaluation               AM-PAC PT "6 Clicks" Daily Activity  Outcome Measure Difficulty turning over in bed (including adjusting bedclothes, sheets and blankets)?: A Little Difficulty moving from lying on back to sitting on the side of the bed? : A Little Difficulty sitting down on and standing up from a chair with arms (e.g., wheelchair, bedside commode, etc,.)?: A Little Help needed moving to and from a bed to chair (including a wheelchair)?: A Little Help needed walking in hospital room?: A Little Help needed climbing 3-5 steps with a railing? : A Little 6 Click Score: 18    End of Session Equipment Utilized During Treatment: Gait belt Activity Tolerance: Patient tolerated treatment well Patient left: in chair;with call bell/phone within reach;with chair alarm set;with family/visitor present Nurse Communication: Mobility status PT Visit Diagnosis: Unsteadiness on feet (R26.81)    Time: 1610-9604 PT Time Calculation (min) (ACUTE ONLY): 20 min   Charges:   PT Evaluation $PT Eval Moderate  Complexity: 1 Mod          Charlotte Crumb, PT DPT  Board Certified Neurologic Specialist [phone]   Fabio Asa 04/18/2018, 9:46 AM

## 2018-04-18 NOTE — ED Provider Notes (Signed)
[facility] EMERGENCY DEPARTMENT Provider Note   CSN: [id number] Arrival date & time: 04/17/18  2330     History   Chief Complaint Chief Complaint  Patient presents with  . Dizziness    HPI Katelyn Mckinney is a 84 y.o. female.  HPI   84 year old female with history of TIAs comes in with chief complaint of dizziness. According to patient's daughter, patient was last seen normal on Friday.  Saturday when she called her daughter, she noted that patient was sounding a bit confused and had slight slurring of her speech.  When she went to assess her mother, she noted that patient was dizzy and unsteady with her walker.  Patient normally walks without a walker and lives independently.  Patient complains of dull pain to the back of her neck. She denies any numbness, tingling, focal weakness.    No past medical history on file.  There are no active problems to display for this patient.   OB History   None      Home Medications    Prior to Admission medications   Medication Sig Start Date End Date Taking? Authorizing Provider  aspirin EC 81 MG tablet Take 81 mg by mouth daily.   Yes [provider]  atenolol (TENORMIN) 50 MG tablet Take 100 mg by mouth daily. 02/16/18  Yes [provider]  clopidogrel (PLAVIX) 75 MG tablet Take 75 mg by mouth daily. 03/07/18  Yes [provider]  LORazepam (ATIVAN) 0.5 MG tablet Take 0.5 mg by mouth at bedtime. 02/22/18  Yes [provider]  meclizine (ANTIVERT) 25 MG tablet Take 25 mg by mouth 3 (three) times daily as needed for dizziness.  01/16/18  Yes [provider]  ranitidine (ZANTAC) 75 MG tablet Take 75 mg by mouth daily as needed for heartburn.   Yes [provider]    Family History No family history on file.  Social History Social History   Tobacco Use  . Smoking status: Not on file  Substance Use Topics  . Alcohol use: Not on file  . Drug use: Not on file       Allergies   Latex   Review of Systems Review of Systems  Constitutional: Positive for activity change.  Respiratory: Negative for shortness of breath.   Cardiovascular: Negative for chest pain.  Gastrointestinal: Negative for nausea and vomiting.  Neurological: Positive for dizziness and headaches.  All other systems reviewed and are negative.    Physical Exam Updated Vital Signs BP (!) 118/97   Pulse 63   Temp 98.4 F (36.9 C)   Resp 16   Wt 68.9 kg   SpO2 100%   Physical Exam  Constitutional: She is oriented to person, place, and time. She appears well-developed.  HENT:  Head: Normocephalic and atraumatic.  Eyes: Pupils are equal, round, and reactive to light.  Saccadic eye movement noted during evaluation of gaze, however there is no pupillary lag appreciated  Neck: Normal range of motion. Neck supple.  Cardiovascular: Normal rate.  Pulmonary/Chest: Effort normal.  Abdominal: Bowel sounds are normal.  Neurological: She is alert and oriented to person, place, and time. No cranial nerve deficit or sensory deficit. Coordination normal.  No dysmetria  Skin: Skin is warm and dry.  Nursing note and vitals reviewed.    ED Treatments / Results  Labs (all labs ordered are listed, but only abnormal results are displayed) Labs Reviewed  COMPREHENSIVE METABOLIC PANEL - Abnormal; Notable for the  following components:      Result Value   Glucose, Bld 109 (*)    All other components within normal limits  I-STAT CHEM 8, ED - Abnormal; Notable for the following components:   Glucose, Bld 107 (*)    Calcium, Ion 1.07 (*)    All other components within normal limits  PROTIME-INR  APTT  CBC  DIFFERENTIAL  I-STAT TROPONIN, ED  CBG MONITORING, ED    EKG None  Radiology Ct Angio Head W Or Wo Contrast  Result Date: 04/18/2018 CLINICAL DATA:  84 y/o  F; ataxia, stroke suspected. EXAM: CT ANGIOGRAPHY HEAD AND NECK TECHNIQUE: Multidetector CT imaging of the head  and neck was performed using the standard protocol during bolus administration of intravenous contrast. Multiplanar CT image reconstructions and MIPs were obtained to evaluate the vascular anatomy. Carotid stenosis measurements (when applicable) are obtained utilizing NASCET criteria, using the distal internal carotid diameter as the denominator. CONTRAST:  50mL ISOVUE-370 IOPAMIDOL (ISOVUE-370) INJECTION 76% COMPARISON:  04/18/2018 CT head. FINDINGS: CTA NECK FINDINGS Aortic arch: Standard branching. Imaged portion shows no evidence of aneurysm or dissection. No significant stenosis of the major arch vessel origins. Severe calcific atherosclerosis. Right carotid system: No evidence of dissection, stenosis (50% or greater) or occlusion. Calcified plaque of the carotid bifurcation with mild less than 50% proximal ICA stenosis. Left carotid system: No evidence of dissection or occlusion. Calcified plaque of the bifurcation with mild to moderate 50% proximal ICA stenosis. Vertebral arteries: Codominant. No evidence of dissection, stenosis (50% or greater) or occlusion. Right vertebral artery C5 level fibrofatty plaque, and C7 level calcified plaque, and origin mixed plaque with mild less 50% stenosis. Skeleton: Moderate multilevel cervical spondylosis. No high-grade bony canal stenosis. Other neck: Negative. Upper chest: 4 mm right upper lobe pulmonary nodule (series 5, image 149). Trace bilateral pleural effusions. Smooth interlobular septal thickening compatible with interstitial edema. Review of the MIP images confirms the above findings CTA HEAD FINDINGS Anterior circulation: Right M1 6 mm segment of near occlusion, severe thread-like stenosis ((series 11, image 22). Diminished downstream right MCA circulation in comparison with the left. No additional large vessel occlusion, aneurysm, segment of high-grade stenosis, or vascular malformation. Calcified plaque of the carotid siphons with mild bilateral paraclinoid  stenosis. Posterior circulation: No significant stenosis, proximal occlusion, aneurysm, or vascular malformation. Venous sinuses: As permitted by contrast timing, patent. Anatomic variants: None significant. Delayed phase: No abnormal intracranial enhancement. Review of the MIP images confirms the above findings IMPRESSION: 1. Right M1 6 mm length segment of near occlusion, severe thread-like stenosis. Diminished downstream right MCA distribution in comparison with the left. CT perfusion of the head may be useful to assess for oligemia. 2. Calcific atherosclerosis of the aorta, carotid arteries, and vertebral arteries. Multiple segments of mild stenosis in the anterior and posterior circulations. 3. No additional large vessel occlusion, hemodynamically significant stenosis by NASCET criteria, dissection, or vascular malformation. 4. 4 mm right upper lobe pulmonary nodule. No follow-up needed if patient is low-risk. Non-contrast chest CT can be considered in 12 months if patient is high-risk. This recommendation follows the consensus statement: Guidelines for Management of Incidental Pulmonary Nodules Detected on CT Images: From the Fleischner Society 2017; Radiology 2017; 284:228-243. 5. Trace bilateral pleural effusions and mild interstitial pulmonary edema. These results were called by telephone at the time of interpretation on 04/18/2018 at 3:32 am to Dr. Derwood KaplanANKIT Graceanna Theissen , who verbally acknowledged these results. Electronically Signed   By: Buzzy HanLance  Furusawa-Stratton M.D.  On: 04/18/2018 03:41   Ct Head Wo Contrast  Result Date: 04/18/2018 CLINICAL DATA:  84 y/o F; dizziness and stumbling when she walks. History of TIAs. EXAM: CT HEAD WITHOUT CONTRAST TECHNIQUE: Contiguous axial images were obtained from the base of the skull through the vertex without intravenous contrast. COMPARISON:  None. FINDINGS: Brain: No evidence of acute infarction, hemorrhage, hydrocephalus, extra-axial collection or mass lesion/mass  effect. Mild chronic microvascular ischemic changes and volume loss of the brain. Small chronic infarction within the left anterior thalamus. Vascular: Calcific atherosclerosis of the carotid siphons. No hyperdense vessel identified. Skull: Normal. Negative for fracture or focal lesion. Sinuses/Orbits: No acute finding. Other: None. IMPRESSION: 1. No acute intracranial abnormality identified. 2. Mild chronic microvascular ischemic changes and volume loss of the brain for age. Small chronic infarct within the left anterior thalamus. Electronically Signed   By: Mitzi Hansen M.D.   On: 04/18/2018 00:23   Ct Angio Neck W And/or Wo Contrast  Result Date: 04/18/2018 CLINICAL DATA:  84 y/o  F; ataxia, stroke suspected. EXAM: CT ANGIOGRAPHY HEAD AND NECK TECHNIQUE: Multidetector CT imaging of the head and neck was performed using the standard protocol during bolus administration of intravenous contrast. Multiplanar CT image reconstructions and MIPs were obtained to evaluate the vascular anatomy. Carotid stenosis measurements (when applicable) are obtained utilizing NASCET criteria, using the distal internal carotid diameter as the denominator. CONTRAST:  50mL ISOVUE-370 IOPAMIDOL (ISOVUE-370) INJECTION 76% COMPARISON:  04/18/2018 CT head. FINDINGS: CTA NECK FINDINGS Aortic arch: Standard branching. Imaged portion shows no evidence of aneurysm or dissection. No significant stenosis of the major arch vessel origins. Severe calcific atherosclerosis. Right carotid system: No evidence of dissection, stenosis (50% or greater) or occlusion. Calcified plaque of the carotid bifurcation with mild less than 50% proximal ICA stenosis. Left carotid system: No evidence of dissection or occlusion. Calcified plaque of the bifurcation with mild to moderate 50% proximal ICA stenosis. Vertebral arteries: Codominant. No evidence of dissection, stenosis (50% or greater) or occlusion. Right vertebral artery C5 level fibrofatty  plaque, and C7 level calcified plaque, and origin mixed plaque with mild less 50% stenosis. Skeleton: Moderate multilevel cervical spondylosis. No high-grade bony canal stenosis. Other neck: Negative. Upper chest: 4 mm right upper lobe pulmonary nodule (series 5, image 149). Trace bilateral pleural effusions. Smooth interlobular septal thickening compatible with interstitial edema. Review of the MIP images confirms the above findings CTA HEAD FINDINGS Anterior circulation: Right M1 6 mm segment of near occlusion, severe thread-like stenosis ((series 11, image 22). Diminished downstream right MCA circulation in comparison with the left. No additional large vessel occlusion, aneurysm, segment of high-grade stenosis, or vascular malformation. Calcified plaque of the carotid siphons with mild bilateral paraclinoid stenosis. Posterior circulation: No significant stenosis, proximal occlusion, aneurysm, or vascular malformation. Venous sinuses: As permitted by contrast timing, patent. Anatomic variants: None significant. Delayed phase: No abnormal intracranial enhancement. Review of the MIP images confirms the above findings IMPRESSION: 1. Right M1 6 mm length segment of near occlusion, severe thread-like stenosis. Diminished downstream right MCA distribution in comparison with the left. CT perfusion of the head may be useful to assess for oligemia. 2. Calcific atherosclerosis of the aorta, carotid arteries, and vertebral arteries. Multiple segments of mild stenosis in the anterior and posterior circulations. 3. No additional large vessel occlusion, hemodynamically significant stenosis by NASCET criteria, dissection, or vascular malformation. 4. 4 mm right upper lobe pulmonary nodule. No follow-up needed if patient is low-risk. Non-contrast chest CT can be considered in  12 months if patient is high-risk. This recommendation follows the consensus statement: Guidelines for Management of Incidental Pulmonary Nodules Detected  on CT Images: From the Fleischner Society 2017; Radiology 2017; 284:228-243. 5. Trace bilateral pleural effusions and mild interstitial pulmonary edema. These results were called by telephone at the time of interpretation on 04/18/2018 at 3:32 am to Dr. Derwood Kaplan , who verbally acknowledged these results. Electronically Signed   By: Mitzi Hansen M.D.   On: 04/18/2018 03:41    Procedures Procedures (including critical care time)  Medications Ordered in ED Medications  aspirin chewable tablet 324 mg (has no administration in time range)  iopamidol (ISOVUE-370) 76 % injection 50 mL (50 mLs Intravenous Contrast Given 04/18/18 0307)     Initial Impression / Assessment and Plan / ED Course  I have reviewed the triage vital signs and the nursing notes.  Pertinent labs & imaging results that were available during my care of the patient were reviewed by me and considered in my medical decision making (see chart for details).  Clinical Course as of Apr 18 410  Wynelle Link Apr 18, 2018  0410 CT scan shows impressive near occlusion of the right M1. Patient reassessed.  She continues to have the dull pain, but denies any new neurologic symptoms.  Patient was ambulated earlier by her nurses and she was quite steady.  I spoke with Dr. Onalee Hua, neurology.  He recommends that patient get admitted to the hospital.  He also would like [place] to give IV fluid because her pressures are on the lower end, bolus has been ordered.   Results from the ER workup discussed with the patient face to face and all questions answered to the best of my ability.   CT Angio Neck W and/or Wo Contrast [AN]    Clinical Course User Index [AN] Derwood Kaplan, MD    84 year old female comes in with chief complaint of dizziness.  Patient has history of TIA.  According to patient's daughter, patient was unsteady with her gait earlier today and also sounded slurred and confused.  Pelvic concerns high for  posterior stroke. Patient was last normal on Friday, therefore she is well outside of the window of any treatment.  Patient does not have any noticeable upper extremity weakness, therefore she is van negative.  We will ambulate the patient in the ED.  Her neuro exam is nonfocal, although daughter still thinks that the speech is slightly slurred. CT angiogram has been ordered, CT scan of the head is negative.  Final Clinical Impressions(s) / ED Diagnoses   Final diagnoses:  Acute ischemic right MCA stroke [facility])    ED Discharge Orders    None       Derwood Kaplan, MD 04/18/18 [phone]

## 2018-04-18 NOTE — Progress Notes (Signed)
VASCULAR LAB PRELIMINARY  PRELIMINARY  PRELIMINARY  PRELIMINARY  Bilateral lower extremity venous duplex completed.    Preliminary report:  There is no DVT or SVT noted in the bilateral lower extremities.   Frederika Hukill, RVT 04/18/2018, 2:51 PM

## 2018-04-18 NOTE — ED Notes (Signed)
Attempted report.  Told to call back in 15 minutes.

## 2018-04-19 ENCOUNTER — Other Ambulatory Visit: Payer: Self-pay | Admitting: Cardiology

## 2018-04-19 DIAGNOSIS — E782 Mixed hyperlipidemia: Secondary | ICD-10-CM

## 2018-04-19 DIAGNOSIS — I10 Essential (primary) hypertension: Secondary | ICD-10-CM

## 2018-04-19 DIAGNOSIS — R911 Solitary pulmonary nodule: Secondary | ICD-10-CM | POA: Diagnosis not present

## 2018-04-19 DIAGNOSIS — I63511 Cerebral infarction due to unspecified occlusion or stenosis of right middle cerebral artery: Secondary | ICD-10-CM

## 2018-04-19 DIAGNOSIS — E785 Hyperlipidemia, unspecified: Secondary | ICD-10-CM | POA: Diagnosis not present

## 2018-04-19 DIAGNOSIS — I052 Rheumatic mitral stenosis with insufficiency: Secondary | ICD-10-CM

## 2018-04-19 DIAGNOSIS — E7800 Pure hypercholesterolemia, unspecified: Secondary | ICD-10-CM

## 2018-04-19 DIAGNOSIS — R7303 Prediabetes: Secondary | ICD-10-CM

## 2018-04-19 DIAGNOSIS — I639 Cerebral infarction, unspecified: Secondary | ICD-10-CM

## 2018-04-19 DIAGNOSIS — Z87891 Personal history of nicotine dependence: Secondary | ICD-10-CM

## 2018-04-19 MED ORDER — EZETIMIBE 10 MG PO TABS: 10.0000 mg | ORAL_TABLET | Freq: Every day | ORAL | 0 refills | Status: AC

## 2018-04-19 MED ORDER — ASPIRIN 325 MG PO TBEC: 325.0000 mg | DELAYED_RELEASE_TABLET | Freq: Every day | ORAL | Status: DC

## 2018-04-19 NOTE — Progress Notes (Signed)
STROKE TEAM PROGRESS NOTE   SUBJECTIVE (INTERVAL HISTORY) Her daughter and son-in-law are at the bedside.  Patient is lying in bed for lunch, without acute abnormality.  MRI brain showed right MCA territory scattered infarct, consistent with right M1 stenosis but not able to rule out cardioembolic source.    OBJECTIVE Vitals:   04/18/18 2325 04/19/18 0404 04/19/18 0755 04/19/18 1100  BP: (!) 148/65 (!) 150/75 (!) 150/55 (!) 143/57  Pulse: 73 90 75 72  Resp: 16 18 18  (!) 21  Temp: 98.1 F (36.7 C) 98.8 F (37.1 C)  98.5 F (36.9 C)  TempSrc: Oral Oral  Oral  SpO2: 96% 95% 98% 97%  Weight:        CBC:  Recent Labs  Lab 04/17/18 2348 04/17/18 2354 04/18/18 0522  WBC 10.0  --  9.6  NEUTROABS 5.4  --   --   HGB 12.5 12.6 12.6  HCT 39.1 37.0 38.5  MCV 92.0  --  92.5  PLT 207  --  199    Basic Metabolic Panel:  Recent Labs  Lab 04/17/18 2348 04/17/18 2354 04/18/18 0522  NA 140 139  --   K 4.3 4.2  --   CL 107 107  --   CO2 24  --   --   GLUCOSE 109* 107*  --   BUN 13 15  --   CREATININE 0.78 0.80 0.88  CALCIUM 9.1  --   --     Lipid Panel:     Component Value Date/Time   CHOL 244 (H) 04/18/2018 0522   TRIG 161 (H) 04/18/2018 0522   HDL 48 04/18/2018 0522   CHOLHDL 5.1 04/18/2018 0522   VLDL 32 04/18/2018 0522   LDLCALC 164 (H) 04/18/2018 0522   HgbA1c:  Lab Results  Component Value Date   HGBA1C 5.8 (H) 04/18/2018   Urine Drug Screen: No results found for: LABOPIA, COCAINSCRNUR, LABBENZ, AMPHETMU, THCU, LABBARB  Alcohol Level No results found for: ETH  IMAGING  Ct Angio Head W Or Wo Contrast Ct Angio Neck W And/or Wo Contrast 04/18/2018 IMPRESSION:  1. Right M1 6 mm length segment of near occlusion, severe thread-like stenosis. Diminished downstream right MCA distribution in comparison with the left.  CT perfusion of the head may be useful to assess for oligemia.  2. Calcific atherosclerosis of the aorta, carotid arteries, and vertebral arteries.  Multiple segments of mild stenosis in the anterior and posterior circulations.  3. No additional large vessel occlusion, hemodynamically significant stenosis by NASCET criteria, dissection, or vascular malformation.  4. 4 mm right upper lobe pulmonary nodule. No follow-up needed if patient is low-risk. Non-contrast chest CT can be considered in 12 months if patient is high-risk. This recommendation follows the consensus statement: Guidelines for Management of Incidental Pulmonary Nodules Detected on CT Images: From the Fleischner Society 2017; Radiology 2017; 284:228-243.  5. Trace bilateral pleural effusions and mild interstitial pulmonary edema.   Ct Head Wo Contrast 04/18/2018 IMPRESSION:  1. No acute intracranial abnormality identified.  2. Mild chronic microvascular ischemic changes and volume loss of the brain for age. Small chronic infarct within the left anterior thalamus.   MRI Brain Wo Contrast  04/18/2018 CLINICAL DATA:  Feelings of lethargy and weakness. Symptoms began yesterday. Ataxia. Right middle cerebral artery disease by CT angiography. EXAM: MRI HEAD WITHOUT CONTRAST TECHNIQUE: Multiplanar, multiecho pulse sequences of the brain and surrounding structures were obtained without intravenous contrast. COMPARISON:  CT studies earlier same day. FINDINGS: Brain: Innumerable  punctate infarctions in the right middle cerebral artery territory affecting the basal ganglia, insular region and frontoparietal deep white matter. Findings are more likely watershed rather than embolic as there is no significant cortical component. Elsewhere, the brainstem and cerebellum are normal. There is old small vessel infarction in the left thalamus. There are mild small vessel ischemic changes elsewhere in the cerebral hemispheric white matter. No mass lesion, hemorrhage, hydrocephalus or extra-axial collection. Vascular: Major vessels at the base of the brain show flow. Skull and upper cervical spine: Negative  Sinuses/Orbits: Clear/normal Other: None IMPRESSION: Multiple punctate acute infarctions in the deep brain and subcortical brain in the right basal ganglia, insular and subinsular region and right frontoparietal region, most consistent with watershed infarctions, given the relative absence of cortical infarction. No evidence of hemorrhage or swelling. Electronically Signed   By: Paulina Fusi M.D.   On: 04/18/2018 13:13    TTE  - Left ventricle: The cavity size was normal. Wall thickness was   normal. Systolic function was normal. The estimated ejection   fraction was in the range of 60% to 65%. The study is not   technically sufficient to allow evaluation of LV diastolic   function. - Aortic valve: Sclerosis without stenosis. - Mitral valve: Severely calcified annulus. Moderately thickened,   moderately calcified leaflets . The findings are consistent with   mild stenosis. There was mild regurgitation. Valve area by   continuity equation (using LVOT flow): 1 cm^2. - Left atrium: The atrium was moderately to severely dilated. - Atrial septum: No defect or patent foramen ovale was identified. - Pulmonary arteries: PA peak pressure: 43 mm Hg (S).  Bilateral Lower Extremity Dopplers Right: There is no evidence of deep vein thrombosis in the lower extremity.There is no evidence of superficial venous thrombosis. Left: There is no evidence of deep vein thrombosis in the lower extremity.There is no evidence of superficial venous thrombosis.     PHYSICAL EXAM Temp:  [97.6 F (36.4 C)-98.8 F (37.1 C)] 98.5 F (36.9 C) (08/26 1100) Pulse Rate:  [70-90] 72 (08/26 1100) Resp:  [16-21] 21 (08/26 1100) BP: (135-150)/(55-113) 143/57 (08/26 1100) SpO2:  [95 %-98 %] 97 % (08/26 1100)  General - Well nourished, well developed, in no apparent distress.  Ophthalmologic - fundi not visualized due to noncooperation.  Cardiovascular - Regular rate and rhythm with no murmur.  Mental Status -  Level  of arousal and orientation to place, and person were intact, but incorrect on month and year. Language including expression, naming, repetition, comprehension was assessed and found intact.  Cranial Nerves II - XII - II - Visual field intact OU. III, IV, VI - Extraocular movements intact. V - Facial sensation intact bilaterally. VII - Facial movement intact bilaterally. VIII - Hearing & vestibular intact bilaterally. X - Palate elevates symmetrically. XI - Chin turning & shoulder shrug intact bilaterally. XII - Tongue protrusion intact.  Motor Strength - The patient's strength was symmetrical in all extremities and pronator drift was absent.  Bulk was normal and fasciculations were absent.   Motor Tone - Muscle tone was assessed at the neck and appendages and was normal.  Reflexes - The patient's reflexes were symmetrical in all extremities and she had no pathological reflexes.  Sensory - Light touch, temperature/pinprick were assessed and were symmetrical.    Coordination - The patient had normal movements in the hands with no ataxia or dysmetria. Mild action tremor was present b/l UEs.  Gait and Station - deferred.  ASSESSMENT/PLAN [name] is a 84 y.o. female with history of TIAs, hypertension, and hyperlipidemia presenting with speech and gait difficulties. She did not receive IV t-PA due to late presentation  Stroke vs. TIA:  MRI pending - may related to right M1 high grade stenosis or potential cardioembolic source  CT head - No acute intracranial abnormality identified.   MRI head - right MCA territory scattered infarcts  CTA H&N - Right M1 6 mm length segment of near occlusion, severe thread-like stenosis.  Aortic atherosclerosis, right VA origin stenosis and left ICA 50% proximal stenosis.  B LE Dopplers no DVT  2D Echo EF 60 to 65%  Recommend 30 day cardiac event monitoring as outpt to rule out afib  LDL - 164  HgbA1c - 5.8  VTE prophylaxis -  Lovenox  aspirin 81 mg daily and clopidogrel 75 mg daily prior to admission, now on aspirin 325 mg daily and clopidogrel 75 mg daily due to intracranial stenosis.  Recommend ASA 325 and plavix 75 DAPT for 3 months and then Plavix alone.  Patient counseled to be compliant with her antithrombotic medications  Ongoing aggressive stroke risk factor management  Therapy recommendations:  HH PT and OT  Disposition:  Pending  Hypertension  Stable . Permissive hypertension (OK if < 220/120) but gradually normalize in 5-7 days . Long-term BP goal normotensive  Hyperlipidemia  Lipid lowering medication PTA:  Statin intolerance  LDL 164, goal < 70  Current lipid lowering medication: Zetia 10 mg daily  Continue statin at discharge  Consider PCSK9 inhibitors as outpatient  Other Stroke Risk Factors  Advanced age  Obesity  Hx of "TIAs" with episode of generalized weakness and near syncope  Family history of stroke - per daughter's report  Other Active Problems  4 mm right upper lobe pulmonary nodule on CTA. Continue outpt follow up.   Statin intolerance   Hospital day # 0  Neurology will sign off. Please call with questions. Pt will follow up with stroke clinic NP at [facility] in about 4 weeks. Thanks for the consult.   Marvel Plan, MD PhD Stroke Neurology 04/19/2018 12:46 PM    To contact Stroke Continuity provider, please refer to [email or web address]. After hours, contact General Neurology

## 2018-04-19 NOTE — Progress Notes (Signed)
Subjective: [name] reported feeling nauseated this morning around 4 am. Her daughter was at the bedside and said when she was helping her mother to the bathroom she felt nauseated and more confused than her initial onset. The patient reported her nausea has improved but she was having an ongoing posterior headache that has not improved since admission. She was alert and oriented to person and place but not time. MRI and echo findings were explained as well as plan for 30 day cardiac event monitoring. Patient's daughter [place] [facility] [place] [facility]. All questions and concerns were addressed.   Objective:  Vital signs in last 24 hours: Vitals:   04/18/18 2006 04/18/18 2325 04/19/18 0404 04/19/18 0755  BP: 135/80 (!) 148/65 (!) 150/75 (!) 150/55  Pulse: 70 73 90 75  Resp: 20 16 18 18   Temp: 97.6 F (36.4 C) 98.1 F (36.7 C) 98.8 F (37.1 C)   TempSrc: Oral Oral Oral   SpO2: 98% 96% 95% 98%  Weight:       Physical Exam  Constitutional: She is well-developed, well-nourished, and in no distress.  Cardiovascular: Normal rate, regular rhythm and normal heart sounds.  No murmur heard. Pulmonary/Chest: Effort normal and breath sounds normal. No respiratory distress. She has no wheezes. She has no rales.  Musculoskeletal: She exhibits no edema.  Neurological: alert and oriented to person and place, not time, CN IV-XII intact, sensation intact bilaterally, upper and lower extremity strength 5/5 bilaterally   Assessment/Plan:  Principal Problem:   CVA (cerebral vascular accident) [facility]HCC)  [name] is a 84 y.o female with prior CVA, HTN, and HLD who presented with <24 hours of slurred speech, confusion, and gait ataxia with imaging suggestive of CVA.    CVA: Patient is alert and oriented to person and place, but continues to remain un-oriented to time. She is no longer slurring her words and no neuro deficits noted. MRI findings listed below. Echocardiogram showed  mild mitral valve stenosis with mild regurgitation. Neurology recommendations DAPT for 3 months and 30 day cardiac event monitoring.  - continue Ezetimibe 10 mg QD  - A1c 5.8%, pre-diabetic range, outpatient f/u  - Telemetry showed sinus rhythm  - Continue ASA and clopidogrel for 3 months, then continue just clopidogrel  - Allow permissive HTN  - PT and OT HHS   MRI IMPRESSION: Multiple punctate acute infarctions in the deep brain and subcortical brain in the right basal ganglia, insular and subinsular region and right frontoparietal region, most consistent with watershed infarctions, given the relative absence of cortical infarction. No evidence of hemorrhage or swelling  Hypertension: Allow permissive HTN in the setting of CVA. - hold atenolol 100 mg QD until follow up with PCP  Hyperlipidemia: -continue Ezetimibe 10 mg QD  Mitral Valve Stenosis:  Echo study illustrated LV EF 60-65%. Study was not technically sufficient to allow evaluation of LV diastolic function. Mitral valve severely calcified annulus, moderately thickened, moderately calcified leaflets, consistent with mild stenosis. Mild regurgitation seen. Left atrium moderately to severely dilated. PA peak pressure 43 mm Hg. Recommend close follow up with cardiology. - follow up with PCP and cardiology   RUL Pulmonary Nodule:  CTA head and neck showed incidental 4 mm RUL nodule. History of former social smoker. No follow-up needed if patient is low-risk; Non-contrast chest CT can be considered in 12 months if patient is high-risk per radiology.  - recommend outpatient follow up with PCP  Dispo: Patient is medically stable for discharge today.  Katelyn Standard, DO 04/19/2018, 10:05 AM Pager: [phone]

## 2018-04-19 NOTE — Discharge Summary (Signed)
Name: Katelyn Mckinney MRN: [id number] DOB: 08-Aug-1926 84 y.o. PCP: Aniceto Boss, MD  Date of Admission: 04/17/2018 11:34 PM Date of Discharge: 04/19/2018 Attending Physician: Earl Lagos, MD  Discharge Diagnosis: 1. CVA 2. Hypertension 3. Hyperlipidemia 4. Mitral Valve Stenosis 5. RUL Pulmonary Nodule  Discharge Medications: Allergies as of 04/19/2018      Reactions   Latex Rash      Medication List    STOP taking these medications   atenolol 50 MG tablet Commonly known as:  TENORMIN     TAKE these medications   aspirin EC 81 MG tablet Take 81 mg by mouth daily.   clopidogrel 75 MG tablet Commonly known as:  PLAVIX Take 75 mg by mouth daily.   ezetimibe 10 MG tablet Commonly known as:  ZETIA Take 1 tablet (10 mg total) by mouth daily. Start taking on:  04/20/2018   LORazepam 0.5 MG tablet Commonly known as:  ATIVAN Take 0.5 mg by mouth at bedtime.   meclizine 25 MG tablet Commonly known as:  ANTIVERT Take 25 mg by mouth 3 (three) times daily as needed for dizziness.   ranitidine 75 MG tablet Commonly known as:  ZANTAC Take 75 mg by mouth daily as needed for heartburn.       Disposition and follow-up:   Katelyn Mckinney was discharged from [facility] in Stable condition.  At the hospital follow up visit please address:  1.  CVA: patient compliance to dual antiplatelet therapy for 3 months  2. Hypertension: resume atenolol if indicated  3. Hyperlipidemia: assess tolerance and compliance to ezetimibe   4. MVS: echo findings suggestive of MVS, cardiology referral   5. RUL Pulmonary nodule: patient is low risk but consider non-contrast CT in 12 months if indicated   2.  Labs / imaging needed at time of follow-up: none  3.  Pending labs/ test needing follow-up: none  Follow-up Appointments: Follow-up Information    [facility] [address] Follow up.   Specialty:  Cardiology Why:  this is for monitor for you to  wear for 30 days to see if irregular heart beat caused the stroke.  the office will call.  Contact information: [address], [address] [place] [address] [phone]       [facility] Follow up.   Why:  They will contact you for the first appointment Contact information:  [address], [place], [place] [address]  [phone]          Hospital Course by problem list: 1. CVA- Patient presented with <24 hours of slurred speech, confusion, and gait ataxia. CT without any acute ischemia, CTA with right M1 segment near occulsion, and MRI with multiple punctate acute infarcts in deep brain and subcortical brain in the right basal ganglia, insular and subinsular region and right frontoparietal region, most consistent with watershed infarctions. Neuro recommended DAPT for 3 months and to continue clopidogrel soley afterwards. Also recommended 30 day cardiac event monitor. Patient clinically approved, discharged on ASA and clopidogrel with PT and OT HHS.  2. Hypertension- Permissive HTN allowed per neuro. Held atenolol and discharged with close follow up with PCP.  3. Hyperlipidemia- LDL 164, patient has been intolerable to statins in the past so she was started on Ezetimibe 10 mg QD.  4. MVS- Echogram showed LV EF 60-65%. Study was not technically sufficient to allow evaluation of LV diastolic function. Mitral valve severely calcified annulus, moderately thickened, moderately  calcified leaflets, consistent with mild stenosis. Mild regurgitation seen. Left atrium moderately to severely dilated. PA peak pressure 43 mm Hg. Recommend close follow up with cardiology. 5. RUL Pulmonary nodule- CTA head and neck showed incidental 4 mm RUL nodule. History of former social smoker. No follow-up needed if patient is low-risk; Non-contrast chest CT can be considered in 12 months if patient is high-risk per radiology.   Discharge Vitals:   BP (!) 150/55 (BP Location: Left  Arm)   Pulse 75   Temp 98.8 F (37.1 C) (Oral)   Resp 18   Wt 68.9 kg   SpO2 98%   Pertinent Labs, Studies, and Procedures:   CBC Latest Ref Rng & Units 04/18/2018 04/17/2018 04/17/2018  WBC 4.0 - 10.5 K/uL 9.6 - 10.0  Hemoglobin 12.0 - 15.0 g/dL 16.1 09.6 04.5  Hematocrit 36.0 - 46.0 % 38.5 37.0 39.1  Platelets 150 - 400 K/uL 199 - 207   CMP Latest Ref Rng & Units 04/18/2018 04/17/2018 04/17/2018  Glucose 70 - 99 mg/dL - 409(W) 119(J)  BUN 8 - 23 mg/dL - 15 13  Creatinine 4.78 - 1.00 mg/dL 2.95 6.21 3.08  Sodium 135 - 145 mmol/L - 139 140  Potassium 3.5 - 5.1 mmol/L - 4.2 4.3  Chloride 98 - 111 mmol/L - 107 107  CO2 22 - 32 mmol/L - - 24  Calcium 8.9 - 10.3 mg/dL - - 9.1  Total Protein 6.5 - 8.1 g/dL - - 6.6  Total Bilirubin 0.3 - 1.2 mg/dL - - 0.6  Alkaline Phos 38 - 126 U/L - - 52  AST 15 - 41 U/L - - 21  ALT 0 - 44 U/L - - 14   Ct Angio Head W Or Wo Contrast  Result Date: 04/18/2018 CLINICAL DATA:  84 y/o  F; ataxia, stroke suspected. EXAM: CT ANGIOGRAPHY HEAD AND NECK TECHNIQUE: Multidetector CT imaging of the head and neck was performed using the standard protocol during bolus administration of intravenous contrast. Multiplanar CT image reconstructions and MIPs were obtained to evaluate the vascular anatomy. Carotid stenosis measurements (when applicable) are obtained utilizing NASCET criteria, using the distal internal carotid diameter as the denominator. CONTRAST:  50mL ISOVUE-370 IOPAMIDOL (ISOVUE-370) INJECTION 76% COMPARISON:  04/18/2018 CT head. FINDINGS: CTA NECK FINDINGS Aortic arch: Standard branching. Imaged portion shows no evidence of aneurysm or dissection. No significant stenosis of the major arch vessel origins. Severe calcific atherosclerosis. Right carotid system: No evidence of dissection, stenosis (50% or greater) or occlusion. Calcified plaque of the carotid bifurcation with mild less than 50% proximal ICA stenosis. Left carotid system: No evidence of dissection  or occlusion. Calcified plaque of the bifurcation with mild to moderate 50% proximal ICA stenosis. Vertebral arteries: Codominant. No evidence of dissection, stenosis (50% or greater) or occlusion. Right vertebral artery C5 level fibrofatty plaque, and C7 level calcified plaque, and origin mixed plaque with mild less 50% stenosis. Skeleton: Moderate multilevel cervical spondylosis. No high-grade bony canal stenosis. Other neck: Negative. Upper chest: 4 mm right upper lobe pulmonary nodule (series 5, image 149). Trace bilateral pleural effusions. Smooth interlobular septal thickening compatible with interstitial edema. Review of the MIP images confirms the above findings CTA HEAD FINDINGS Anterior circulation: Right M1 6 mm segment of near occlusion, severe thread-like stenosis ((series 11, image 22). Diminished downstream right MCA circulation in comparison with the left. No additional large vessel occlusion, aneurysm, segment of high-grade stenosis, or vascular malformation. Calcified plaque of the carotid siphons with mild bilateral paraclinoid  stenosis. Posterior circulation: No significant stenosis, proximal occlusion, aneurysm, or vascular malformation. Venous sinuses: As permitted by contrast timing, patent. Anatomic variants: None significant. Delayed phase: No abnormal intracranial enhancement. Review of the MIP images confirms the above findings IMPRESSION: 1. Right M1 6 mm length segment of near occlusion, severe thread-like stenosis. Diminished downstream right MCA distribution in comparison with the left. CT perfusion of the head may be useful to assess for oligemia. 2. Calcific atherosclerosis of the aorta, carotid arteries, and vertebral arteries. Multiple segments of mild stenosis in the anterior and posterior circulations. 3. No additional large vessel occlusion, hemodynamically significant stenosis by NASCET criteria, dissection, or vascular malformation. 4. 4 mm right upper lobe pulmonary nodule.  No follow-up needed if patient is low-risk. Non-contrast chest CT can be considered in 12 months if patient is high-risk. This recommendation follows the consensus statement: Guidelines for Management of Incidental Pulmonary Nodules Detected on CT Images: From the Fleischner Society 2017; Radiology 2017; 284:228-243. 5. Trace bilateral pleural effusions and mild interstitial pulmonary edema. These results were called by telephone at the time of interpretation on 04/18/2018 at 3:32 am to Dr. Derwood Kaplan , who verbally acknowledged these results. Electronically Signed   By: Mitzi Hansen M.D.   On: 04/18/2018 03:41   Ct Head Wo Contrast  Result Date: 04/18/2018 CLINICAL DATA:  84 y/o F; dizziness and stumbling when she walks. History of TIAs. EXAM: CT HEAD WITHOUT CONTRAST TECHNIQUE: Contiguous axial images were obtained from the base of the skull through the vertex without intravenous contrast. COMPARISON:  None. FINDINGS: Brain: No evidence of acute infarction, hemorrhage, hydrocephalus, extra-axial collection or mass lesion/mass effect. Mild chronic microvascular ischemic changes and volume loss of the brain. Small chronic infarction within the left anterior thalamus. Vascular: Calcific atherosclerosis of the carotid siphons. No hyperdense vessel identified. Skull: Normal. Negative for fracture or focal lesion. Sinuses/Orbits: No acute finding. Other: None. IMPRESSION: 1. No acute intracranial abnormality identified. 2. Mild chronic microvascular ischemic changes and volume loss of the brain for age. Small chronic infarct within the left anterior thalamus. Electronically Signed   By: Mitzi Hansen M.D.   On: 04/18/2018 00:23   Ct Angio Neck W And/or Wo Contrast  Result Date: 04/18/2018 CLINICAL DATA:  84 y/o  F; ataxia, stroke suspected. EXAM: CT ANGIOGRAPHY HEAD AND NECK TECHNIQUE: Multidetector CT imaging of the head and neck was performed using the standard protocol during bolus  administration of intravenous contrast. Multiplanar CT image reconstructions and MIPs were obtained to evaluate the vascular anatomy. Carotid stenosis measurements (when applicable) are obtained utilizing NASCET criteria, using the distal internal carotid diameter as the denominator. CONTRAST:  50mL ISOVUE-370 IOPAMIDOL (ISOVUE-370) INJECTION 76% COMPARISON:  04/18/2018 CT head. FINDINGS: CTA NECK FINDINGS Aortic arch: Standard branching. Imaged portion shows no evidence of aneurysm or dissection. No significant stenosis of the major arch vessel origins. Severe calcific atherosclerosis. Right carotid system: No evidence of dissection, stenosis (50% or greater) or occlusion. Calcified plaque of the carotid bifurcation with mild less than 50% proximal ICA stenosis. Left carotid system: No evidence of dissection or occlusion. Calcified plaque of the bifurcation with mild to moderate 50% proximal ICA stenosis. Vertebral arteries: Codominant. No evidence of dissection, stenosis (50% or greater) or occlusion. Right vertebral artery C5 level fibrofatty plaque, and C7 level calcified plaque, and origin mixed plaque with mild less 50% stenosis. Skeleton: Moderate multilevel cervical spondylosis. No high-grade bony canal stenosis. Other neck: Negative. Upper chest: 4 mm right upper lobe pulmonary nodule (  series 5, image 149). Trace bilateral pleural effusions. Smooth interlobular septal thickening compatible with interstitial edema. Review of the MIP images confirms the above findings CTA HEAD FINDINGS Anterior circulation: Right M1 6 mm segment of near occlusion, severe thread-like stenosis ((series 11, image 22). Diminished downstream right MCA circulation in comparison with the left. No additional large vessel occlusion, aneurysm, segment of high-grade stenosis, or vascular malformation. Calcified plaque of the carotid siphons with mild bilateral paraclinoid stenosis. Posterior circulation: No significant stenosis,  proximal occlusion, aneurysm, or vascular malformation. Venous sinuses: As permitted by contrast timing, patent. Anatomic variants: None significant. Delayed phase: No abnormal intracranial enhancement. Review of the MIP images confirms the above findings IMPRESSION: 1. Right M1 6 mm length segment of near occlusion, severe thread-like stenosis. Diminished downstream right MCA distribution in comparison with the left. CT perfusion of the head may be useful to assess for oligemia. 2. Calcific atherosclerosis of the aorta, carotid arteries, and vertebral arteries. Multiple segments of mild stenosis in the anterior and posterior circulations. 3. No additional large vessel occlusion, hemodynamically significant stenosis by NASCET criteria, dissection, or vascular malformation. 4. 4 mm right upper lobe pulmonary nodule. No follow-up needed if patient is low-risk. Non-contrast chest CT can be considered in 12 months if patient is high-risk. This recommendation follows the consensus statement: Guidelines for Management of Incidental Pulmonary Nodules Detected on CT Images: From the Fleischner Society 2017; Radiology 2017; 284:228-243. 5. Trace bilateral pleural effusions and mild interstitial pulmonary edema. These results were called by telephone at the time of interpretation on 04/18/2018 at 3:32 am to Dr. Derwood KaplanANKIT NANAVATI , who verbally acknowledged these results. Electronically Signed   By: Mitzi HansenLance  Furusawa-Stratton M.D.   On: 04/18/2018 03:41   Mr Brain Wo Contrast  Result Date: 04/18/2018 CLINICAL DATA:  Feelings of lethargy and weakness. Symptoms began yesterday. Ataxia. Right middle cerebral artery disease by CT angiography. EXAM: MRI HEAD WITHOUT CONTRAST TECHNIQUE: Multiplanar, multiecho pulse sequences of the brain and surrounding structures were obtained without intravenous contrast. COMPARISON:  CT studies earlier same day. FINDINGS: Brain: Innumerable punctate infarctions in the right middle cerebral artery  territory affecting the basal ganglia, insular region and frontoparietal deep white matter. Findings are more likely watershed rather than embolic as there is no significant cortical component. Elsewhere, the brainstem and cerebellum are normal. There is old small vessel infarction in the left thalamus. There are mild small vessel ischemic changes elsewhere in the cerebral hemispheric white matter. No mass lesion, hemorrhage, hydrocephalus or extra-axial collection. Vascular: Major vessels at the base of the brain show flow. Skull and upper cervical spine: Negative Sinuses/Orbits: Clear/normal Other: None IMPRESSION: Multiple punctate acute infarctions in the deep brain and subcortical brain in the right basal ganglia, insular and subinsular region and right frontoparietal region, most consistent with watershed infarctions, given the relative absence of cortical infarction. No evidence of hemorrhage or swelling. Electronically Signed   By: Paulina FusiMark  Shogry M.D.   On: 04/18/2018 13:13   Discharge Instructions: Discharge Instructions    Diet - low sodium heart healthy   Complete by:  As directed    Diet - low sodium heart healthy   Complete by:  As directed    Discharge instructions   Complete by:  As directed    Ms. Macfadden,  Please not the following changes to your medications:  -HOLD atenolol 100 mg until you follow up with your PCP -START Zetia 10 mg once a day  -Continue to take aspirin 81 mg and  plavex 75 mg once a day for 3 months, then continue to take plavex 75 mg    Please follow up with your PCP as soon as possible. Thank you for allowing [place] to be a part of your care!   Increase activity slowly   Complete by:  As directed    Increase activity slowly   Complete by:  As directed       Signed: Rehman, Callie Fielding, DO 04/19/2018, 11:08 AM   Pager: [phone]

## 2018-04-19 NOTE — Progress Notes (Signed)
Pt discharge via wheelchair.  VSS.   

## 2018-04-19 NOTE — Progress Notes (Signed)
Internal Medicine Attending  Date: 04/19/2018  Patient name: Katelyn LeydenDoris T Mckinney Medical record number: [id number] Date of birth: [birth year] Age: 84 y.o. Gender: female  I saw and evaluated the patient. I reviewed the resident's note by Dr. Karilyn Cotaehman and I agree with the resident's findings and plans as documented in her progress note.  When seen on rounds this morning [name] was lying comfortably in bed in no acute distress.  She was alert and oriented 2 but did not know the year. Her motor and sensory exams were unremarkable. There was no dysarthria. We did not assess gait. We appreciate neurology's recommendations to continue dual antiplatelet therapy for 3 months and then Plavix thereafter. In addition, they recommended a 30 day event monitor to assess for possible cardioembolic source given the MRI and negative cardiac workup thus far. As a former smoker, she is at risk for lung cancer and the 4 mm nodule will require repeat CT scanning to assess for changes in size in 12 months. I agree with discharge home today with home health services.

## 2018-04-19 NOTE — Care Management Note (Signed)
Case Management Note  Patient Details  Name: Katelyn Mckinney MRN: [id number] Date of Birth: 31-Dec-1925  Subjective/Objective:      Pt admitted with CVA. She is from home alone.              Action/Plan: Pt discharging [facility]. CM provided choice and [facility] selected. CM spoke to [facility] and faxed them the required information.  Pt with orders for a 3 in 1. [name] [facility] notified and equipment delivered to the room. Pts daughter to provide transport home and 24 supervision once home.   Expected Discharge Date:  04/19/18               Expected Discharge Plan:  Home w Home Health Services  In-House Referral:     Discharge planning Services  CM Consult  Post Acute Care Choice:  Home Health, Durable Medical Equipment Choice offered to:  Patient  DME Arranged:  3-N-1 DME Agency:     HH Arranged:  PT, OT HH Agency:  [facility]  Status of Service:  Completed, signed off  If discussed at Long Length of Stay Meetings, dates discussed:    Additional Comments:  Kermit BaloKelli F Amoria Mclees, RN 04/19/2018, 12:25 PM

## 2018-04-20 NOTE — Progress Notes (Signed)
04/19/2018 late yesterday CM received call that [facility] [place] not in network with patients particular [employer] medicare policy.   04/20/2018 CM called several other HH agencies in [place] and no one takes [employer] medicare. CM called patients [employer] medicare and they sent me to a web page that shows [facility] for [facility] policy. When information entered at [email or web address] no HH agencies show in network for 100 miles.  CM called patients daughter and encouraged her to reach out to [facility] for any further information. CM received phone call back from daughter that [facility], [facility], [facility] are in network with her policy.  CM called [facility] and they do not go to [place]. [facility] again stated they are not in network, [facility] says they dont have the staff.  CM called daughter back and she is deciding about [place] her [place]. She asked about a hospital bed. CM encouraged her to reach back out to CM if she does [place] her [place] [facility] [place] with [facility]. Also instructed her to call mothers PCP about getting a bed for her home.

## 2018-04-21 ENCOUNTER — Other Ambulatory Visit: Payer: Self-pay

## 2018-04-21 NOTE — Patient Outreach (Addendum)
[facility]  04/21/2018  Lizabeth LeydenDoris T Mckinney 1925-09-18 [id number]  EMMI: stroke red alert Referral date: 04/21/18 Referral reason: Feeling worse overall: yes, New / worsening overall: YES, New / worsening pain / fever/ shortness of breath; YES, Filled new prescriptions: no , Been able to take every does of medicine. ; No , Problems setting up rehab : yes Insurance:  Day # 1  Telephone call to patient regarding EMMI stroke red alert. HIPAA verified with patient.  Patient gave permission to speak with her daughter, Katelyn Mckinney.  Explained reason for call.  Daughter states patient woke up with a really bad headache on yesterday morning. She states this eventually got better.  Daughter states she has had difficulty getting home health set up for patient. Daughter states she has been working with the case manager from the hospital and another representative from patients insurance to get home health set up.  She states several of the home health agencies do not accept patients insurance.  Daughter states she has filled patients prescriptions so patient has all her medicines at this time.  Daughter states patient cannot be left alone.  She states patient is having difficulty understanding things. She states patient is not remembering things well.  Daughter states she has to assist patient with everything.  Daughter states she does have some family support but patient is requiring around the clock assistance.  [facility].  Offered [facility] to [facility] regarding [facility] duty care.  Daughter states she would like to talk with social worker regarding this.   RNCM reviewed stroke symptoms with daughter. Advised that 911 should be called if patient exhibit these symptoms. Daughter verbalized understanding.   PLAN: RNCM will [facility].  RNCM will follow up with patient within 4 business days.    Katelyn InaDavina Krystel Fletchall RN,BSN,CCM [facility] Telephonic  [phone]     PLAN:

## 2018-04-22 ENCOUNTER — Inpatient Hospital Stay (HOSPITAL_COMMUNITY): Payer: Medicare Other

## 2018-04-22 ENCOUNTER — Other Ambulatory Visit: Payer: Self-pay

## 2018-04-22 ENCOUNTER — Emergency Department (HOSPITAL_COMMUNITY): Payer: Medicare Other

## 2018-04-22 ENCOUNTER — Inpatient Hospital Stay (HOSPITAL_COMMUNITY)
Admission: EM | Admit: 2018-04-22 | Discharge: 2018-04-27 | DRG: 065 | Disposition: A | Discharge status: Skilled Nursing Facility | Payer: Medicare Other | Attending: Internal Medicine | Admitting: Internal Medicine

## 2018-04-22 ENCOUNTER — Encounter (HOSPITAL_COMMUNITY): Payer: Self-pay | Admitting: Neurology

## 2018-04-22 DIAGNOSIS — Z96659 Presence of unspecified artificial knee joint: Secondary | ICD-10-CM | POA: Diagnosis present

## 2018-04-22 DIAGNOSIS — I951 Orthostatic hypotension: Secondary | ICD-10-CM | POA: Diagnosis present

## 2018-04-22 DIAGNOSIS — Z9841 Cataract extraction status, right eye: Secondary | ICD-10-CM | POA: Diagnosis not present

## 2018-04-22 DIAGNOSIS — I63411 Cerebral infarction due to embolism of right middle cerebral artery: Secondary | ICD-10-CM | POA: Diagnosis present

## 2018-04-22 DIAGNOSIS — I480 Paroxysmal atrial fibrillation: Secondary | ICD-10-CM | POA: Diagnosis present

## 2018-04-22 DIAGNOSIS — Z66 Do not resuscitate: Secondary | ICD-10-CM | POA: Diagnosis present

## 2018-04-22 DIAGNOSIS — I63511 Cerebral infarction due to unspecified occlusion or stenosis of right middle cerebral artery: Secondary | ICD-10-CM | POA: Diagnosis not present

## 2018-04-22 DIAGNOSIS — Z9842 Cataract extraction status, left eye: Secondary | ICD-10-CM

## 2018-04-22 DIAGNOSIS — I10 Essential (primary) hypertension: Secondary | ICD-10-CM | POA: Diagnosis present

## 2018-04-22 DIAGNOSIS — Z8673 Personal history of transient ischemic attack (TIA), and cerebral infarction without residual deficits: Secondary | ICD-10-CM | POA: Diagnosis not present

## 2018-04-22 DIAGNOSIS — R911 Solitary pulmonary nodule: Secondary | ICD-10-CM | POA: Diagnosis present

## 2018-04-22 DIAGNOSIS — Z7982 Long term (current) use of aspirin: Secondary | ICD-10-CM

## 2018-04-22 DIAGNOSIS — I639 Cerebral infarction, unspecified: Secondary | ICD-10-CM | POA: Diagnosis present

## 2018-04-22 DIAGNOSIS — K219 Gastro-esophageal reflux disease without esophagitis: Secondary | ICD-10-CM | POA: Diagnosis present

## 2018-04-22 DIAGNOSIS — I080 Rheumatic disorders of both mitral and aortic valves: Secondary | ICD-10-CM | POA: Diagnosis present

## 2018-04-22 DIAGNOSIS — R29703 NIHSS score 3: Secondary | ICD-10-CM | POA: Diagnosis present

## 2018-04-22 DIAGNOSIS — Z79899 Other long term (current) drug therapy: Secondary | ICD-10-CM | POA: Diagnosis not present

## 2018-04-22 DIAGNOSIS — Z8249 Family history of ischemic heart disease and other diseases of the circulatory system: Secondary | ICD-10-CM

## 2018-04-22 DIAGNOSIS — Z91040 Latex allergy status: Secondary | ICD-10-CM | POA: Diagnosis not present

## 2018-04-22 DIAGNOSIS — G8194 Hemiplegia, unspecified affecting left nondominant side: Secondary | ICD-10-CM | POA: Diagnosis present

## 2018-04-22 DIAGNOSIS — G8191 Hemiplegia, unspecified affecting right dominant side: Secondary | ICD-10-CM | POA: Diagnosis not present

## 2018-04-22 DIAGNOSIS — I63311 Cerebral infarction due to thrombosis of right middle cerebral artery: Secondary | ICD-10-CM | POA: Diagnosis not present

## 2018-04-22 DIAGNOSIS — G8192 Hemiplegia, unspecified affecting left dominant side: Secondary | ICD-10-CM | POA: Diagnosis not present

## 2018-04-22 DIAGNOSIS — R260 Ataxic gait: Secondary | ICD-10-CM | POA: Diagnosis present

## 2018-04-22 DIAGNOSIS — Z823 Family history of stroke: Secondary | ICD-10-CM | POA: Diagnosis not present

## 2018-04-22 DIAGNOSIS — R471 Dysarthria and anarthria: Secondary | ICD-10-CM | POA: Diagnosis present

## 2018-04-22 DIAGNOSIS — R29810 Facial weakness: Secondary | ICD-10-CM | POA: Diagnosis present

## 2018-04-22 DIAGNOSIS — R7303 Prediabetes: Secondary | ICD-10-CM | POA: Diagnosis present

## 2018-04-22 DIAGNOSIS — I4891 Unspecified atrial fibrillation: Secondary | ICD-10-CM

## 2018-04-22 DIAGNOSIS — I342 Nonrheumatic mitral (valve) stenosis: Secondary | ICD-10-CM

## 2018-04-22 DIAGNOSIS — G4700 Insomnia, unspecified: Secondary | ICD-10-CM | POA: Diagnosis not present

## 2018-04-22 DIAGNOSIS — Z87891 Personal history of nicotine dependence: Secondary | ICD-10-CM

## 2018-04-22 DIAGNOSIS — E785 Hyperlipidemia, unspecified: Secondary | ICD-10-CM | POA: Diagnosis present

## 2018-04-22 DIAGNOSIS — E7800 Pure hypercholesterolemia, unspecified: Secondary | ICD-10-CM | POA: Diagnosis present

## 2018-04-22 DIAGNOSIS — Z7902 Long term (current) use of antithrombotics/antiplatelets: Secondary | ICD-10-CM

## 2018-04-22 DIAGNOSIS — Z7901 Long term (current) use of anticoagulants: Secondary | ICD-10-CM | POA: Diagnosis not present

## 2018-04-22 DIAGNOSIS — R1310 Dysphagia, unspecified: Secondary | ICD-10-CM | POA: Diagnosis not present

## 2018-04-22 HISTORY — DX: Hyperlipidemia, unspecified: E78.5

## 2018-04-22 HISTORY — DX: Unspecified atrial fibrillation: I48.91

## 2018-04-22 HISTORY — DX: Cerebral infarction, unspecified: I63.9

## 2018-04-22 HISTORY — DX: Gastro-esophageal reflux disease without esophagitis: K21.9

## 2018-04-22 HISTORY — DX: Diverticulitis of intestine, part unspecified, without perforation or abscess without bleeding: K57.92

## 2018-04-22 HISTORY — DX: Essential (primary) hypertension: I10

## 2018-04-22 LAB — DIFFERENTIAL
Abs Immature Granulocytes: 0 10*3/uL (ref 0.0–0.1)
Basophils Absolute: 0 10*3/uL (ref 0.0–0.1)
Basophils Relative: 0 %
EOS PCT: 1 %
Eosinophils Absolute: 0.1 10*3/uL (ref 0.0–0.7)
Immature Granulocytes: 0 %
LYMPHS ABS: 3.8 10*3/uL (ref 0.7–4.0)
LYMPHS PCT: 37 %
Monocytes Absolute: 0.9 10*3/uL (ref 0.1–1.0)
Monocytes Relative: 9 %
Neutro Abs: 5.3 10*3/uL (ref 1.7–7.7)
Neutrophils Relative %: 53 %

## 2018-04-22 LAB — I-STAT CHEM 8, ED
BUN: 16 mg/dL (ref 8–23)
CALCIUM ION: 1.15 mmol/L (ref 1.15–1.40)
Chloride: 106 mmol/L (ref 98–111)
Creatinine, Ser: 0.9 mg/dL (ref 0.44–1.00)
Glucose, Bld: 142 mg/dL — ABNORMAL HIGH (ref 70–99)
HCT: 42 % (ref 36.0–46.0)
HEMOGLOBIN: 14.3 g/dL (ref 12.0–15.0)
Potassium: 3.8 mmol/L (ref 3.5–5.1)
SODIUM: 140 mmol/L (ref 135–145)
TCO2: 24 mmol/L (ref 22–32)

## 2018-04-22 LAB — APTT: aPTT: 29 seconds (ref 24–36)

## 2018-04-22 LAB — CBC
HEMATOCRIT: 42.9 % (ref 36.0–46.0)
Hemoglobin: 13.8 g/dL (ref 12.0–15.0)
MCH: 29.6 pg (ref 26.0–34.0)
MCHC: 32.2 g/dL (ref 30.0–36.0)
MCV: 92.1 fL (ref 78.0–100.0)
Platelets: 199 10*3/uL (ref 150–400)
RBC: 4.66 MIL/uL (ref 3.87–5.11)
RDW: 12.7 % (ref 11.5–15.5)
WBC: 10.2 10*3/uL (ref 4.0–10.5)

## 2018-04-22 LAB — COMPREHENSIVE METABOLIC PANEL
ALK PHOS: 49 U/L (ref 38–126)
ALT: 23 U/L (ref 0–44)
ANION GAP: 11 (ref 5–15)
AST: 33 U/L (ref 15–41)
Albumin: 3.4 g/dL — ABNORMAL LOW (ref 3.5–5.0)
BILIRUBIN TOTAL: 1 mg/dL (ref 0.3–1.2)
BUN: 14 mg/dL (ref 8–23)
CALCIUM: 9 mg/dL (ref 8.9–10.3)
CO2: 22 mmol/L (ref 22–32)
Chloride: 106 mmol/L (ref 98–111)
Creatinine, Ser: 1.02 mg/dL — ABNORMAL HIGH (ref 0.44–1.00)
GFR, EST AFRICAN AMERICAN: 55 mL/min — AB (ref >60)
GFR, EST NON AFRICAN AMERICAN: 47 mL/min — AB (ref >60)
Glucose, Bld: 142 mg/dL — ABNORMAL HIGH (ref 70–99)
Potassium: 4.1 mmol/L (ref 3.5–5.1)
Sodium: 139 mmol/L (ref 135–145)
TOTAL PROTEIN: 6.3 g/dL — AB (ref 6.5–8.1)

## 2018-04-22 LAB — PROTIME-INR
INR: 1.12
Prothrombin Time: 14.3 seconds (ref 11.4–15.2)

## 2018-04-22 LAB — I-STAT TROPONIN, ED: TROPONIN I, POC: 0.02 ng/mL (ref 0.00–0.08)

## 2018-04-22 MED ORDER — EZETIMIBE 10 MG PO TABS
10.0000 mg | ORAL_TABLET | Freq: Every day | ORAL | Status: DC
  Administered 2018-04-22 – 2018-04-27 (×6): 10 mg via ORAL
  Filled 2018-04-22 (×6): qty 1

## 2018-04-22 MED ORDER — ACETAMINOPHEN 160 MG/5ML PO SOLN: 650.0000 mg | ORAL | Status: DC | PRN

## 2018-04-22 MED ORDER — SODIUM CHLORIDE 0.9 % IV BOLUS
500.0000 mL | Freq: Once | INTRAVENOUS | Status: AC
  Administered 2018-04-22: 500 mL via INTRAVENOUS

## 2018-04-22 MED ORDER — FAMOTIDINE 10 MG PO TABS: 10.0000 mg | ORAL_TABLET | Freq: Every day | ORAL | Status: DC | PRN

## 2018-04-22 MED ORDER — METOPROLOL TARTRATE 5 MG/5ML IV SOLN
5.0000 mg | Freq: Once | INTRAVENOUS | Status: AC
  Administered 2018-04-22: 5 mg via INTRAVENOUS
  Filled 2018-04-22: qty 5

## 2018-04-22 MED ORDER — LORAZEPAM 0.5 MG PO TABS
0.5000 mg | ORAL_TABLET | Freq: Every day | ORAL | Status: DC
  Administered 2018-04-22 – 2018-04-27 (×5): 0.5 mg via ORAL
  Filled 2018-04-22 (×5): qty 1

## 2018-04-22 MED ORDER — ACETAMINOPHEN 650 MG RE SUPP: 650.0000 mg | RECTAL | Status: DC | PRN

## 2018-04-22 MED ORDER — IOPAMIDOL (ISOVUE-370) INJECTION 76%
INTRAVENOUS | Status: AC
  Filled 2018-04-22: qty 100

## 2018-04-22 MED ORDER — CLOPIDOGREL BISULFATE 75 MG PO TABS
75.0000 mg | ORAL_TABLET | Freq: Every day | ORAL | Status: DC
  Administered 2018-04-22 – 2018-04-27 (×6): 75 mg via ORAL
  Filled 2018-04-22 (×6): qty 1

## 2018-04-22 MED ORDER — ACETAMINOPHEN 325 MG PO TABS
650.0000 mg | ORAL_TABLET | ORAL | Status: DC | PRN
  Administered 2018-04-23 – 2018-04-24 (×3): 650 mg via ORAL
  Filled 2018-04-22 (×3): qty 2

## 2018-04-22 MED ORDER — SENNOSIDES-DOCUSATE SODIUM 8.6-50 MG PO TABS
1.0000 | ORAL_TABLET | Freq: Every evening | ORAL | Status: DC | PRN
  Administered 2018-04-25: 1 via ORAL
  Filled 2018-04-22: qty 1

## 2018-04-22 MED ORDER — ENOXAPARIN SODIUM 40 MG/0.4ML ~~LOC~~ SOLN
40.0000 mg | SUBCUTANEOUS | Status: DC
  Administered 2018-04-22 – 2018-04-26 (×5): 40 mg via SUBCUTANEOUS
  Filled 2018-04-22 (×5): qty 0.4

## 2018-04-22 MED ORDER — STROKE: EARLY STAGES OF RECOVERY BOOK
Freq: Once | Status: DC
  Filled 2018-04-22: qty 1

## 2018-04-22 MED ORDER — IOPAMIDOL (ISOVUE-370) INJECTION 76%
100.0000 mL | Freq: Once | INTRAVENOUS | Status: AC | PRN
  Administered 2018-04-22: 100 mL via INTRAVENOUS

## 2018-04-22 MED ORDER — ASPIRIN EC 325 MG PO TBEC
325.0000 mg | DELAYED_RELEASE_TABLET | Freq: Every day | ORAL | Status: DC
  Administered 2018-04-23 – 2018-04-27 (×5): 325 mg via ORAL
  Filled 2018-04-22 (×6): qty 1

## 2018-04-22 NOTE — Consult Note (Addendum)
Neurology Consultation  Reason for Consult: Code stroke Referring Physician: Clarice Pole  CC: Left-sided weakness  History is obtained from: Chart, family  HPI: Katelyn Mckinney is a 84 y.o. female with history of CVA, GERD, hyperlipidemia who presented to the hospital secondary to her daughter hearing a thud at approximately 0800 hrs. this morning.  At that time apparently she had a left facial droop (?)  and was drooling.  Patient does have memory deficits from her previous stroke.  No significant history was able to be obtained from her.  At time of examination patient still was confused however she did not show any localizing lateralizing deficits.  Due to recent stroke and no significant deficits, and outside window TPA was not administered.   Patient was brought in approximately 5 days ago and found to have a right MCA territory scattered infarct with right M1 stenosis.  CTA showed a right M1 the 6 mm length segment of near occlusion, severe thread like stenosis.  Diminished downstream flow right MCA distribution in comparison with the left.  MRI showed multiple punctate acute infarcts in the deep brain and subcortical brain in the right basal ganglia, insular and subinsular region of the right frontal parietal region most consistent with watershed infarcts.  Echocardiogram showed an EF of 60% to 65%.  At that time it was recommended a 30-day cardiac event monitor as an outpatient to rule out atrial fibrillation.  She was also placed on aspirin and Plavix for 3 months and then Plavix alone.  On this admission she was found to be in Afib.   LKW: 2230 on 04/22/2018 tpa given?: no, outside window and recent stroke Premorbid modified Rankin scale (mRS): 2 NIH scale 3, varies to 8   ROS: Unable to obtain due to altered mental status.   Past Medical History:  Diagnosis Date  . CVA (cerebral vascular accident) (HCC)   . GERD (gastroesophageal reflux disease)   . Hyperlipidemia     No family  history on file. No stroke in young age in family  Social History:   has no tobacco, alcohol, and drug history on file.  Medications No current facility-administered medications for this encounter.   Current Outpatient Medications:  .  aspirin EC 325 MG EC tablet, Take 1 tablet (325 mg total) by mouth daily., Disp: 90 tablet, Rfl:  .  clopidogrel (PLAVIX) 75 MG tablet, Take 75 mg by mouth daily., Disp: , Rfl: 1 .  ezetimibe (ZETIA) 10 MG tablet, Take 1 tablet (10 mg total) by mouth daily., Disp: 30 tablet, Rfl: 0 .  LORazepam (ATIVAN) 0.5 MG tablet, Take 0.5 mg by mouth at bedtime., Disp: , Rfl: 2 .  meclizine (ANTIVERT) 25 MG tablet, Take 25 mg by mouth 3 (three) times daily as needed for dizziness. , Disp: , Rfl: 1 .  ranitidine (ZANTAC) 75 MG tablet, Take 75 mg by mouth daily as needed for heartburn., Disp: , Rfl:    Exam: Current vital signs: BP (!) 152/123 (BP Location: Right Arm)   Pulse (!) 126   Resp 18   SpO2 99%  Vital signs in last 24 hours: Pulse Rate:  [126] 126 (08/29 0956) Resp:  [18] 18 (08/29 0956) BP: (152)/(123) 152/123 (08/29 0956) SpO2:  [99 %] 99 % (08/29 0956)  GENERAL: Awake, alert in NAD HEENT: - Normocephalic and atraumatic, Ext: warm, well perfused, intact peripheral pulses,  NEURO:  Mental Status: She knew her age but had difficulty with date and location., speech is clear  however hypophonic.  Naming, repetition, fluency, currently with comprehensive Cranial Nerves: PERRL 2 mm/brisk. EOMI, visual fields full, no facial asymmetry,facial sensation intact, hearing intact, tongue Motor: 4/5 throughout Tone: is normal and bulk is normal Sensation- Intact to light touch bilaterally Coordination: FTN intact bilaterally Gait- deferred  About 10 minutes after entering room she was noted to have a left hemianopsia and aphasia --upon returinng to room she had cleared.  Due to the right stenosis in the MCA CTA head and neck along with perfusion will be  obtained and patient will be given  500 cc bolus of NS to improve her blood pressure   Labs I have reviewed labs in epic and the results pertinent to this consultation are:  CBC    Component Value Date/Time   WBC 10.2 04/22/2018 1005   RBC 4.66 04/22/2018 1005   HGB 14.3 04/22/2018 1014   HCT 42.0 04/22/2018 1014   PLT 199 04/22/2018 1005   MCV 92.1 04/22/2018 1005   MCH 29.6 04/22/2018 1005   MCHC 32.2 04/22/2018 1005   RDW 12.7 04/22/2018 1005   LYMPHSABS 3.8 04/22/2018 1005   MONOABS 0.9 04/22/2018 1005   EOSABS 0.1 04/22/2018 1005   BASOSABS 0.0 04/22/2018 1005    CMP     Component Value Date/Time   NA 140 04/22/2018 1014   K 3.8 04/22/2018 1014   CL 106 04/22/2018 1014   CO2 24 04/17/2018 2348   GLUCOSE 142 (H) 04/22/2018 1014   BUN 16 04/22/2018 1014   CREATININE 0.90 04/22/2018 1014   CALCIUM 9.1 04/17/2018 2348   PROT 6.6 04/17/2018 2348   ALBUMIN 3.5 04/17/2018 2348   AST 21 04/17/2018 2348   ALT 14 04/17/2018 2348   ALKPHOS 52 04/17/2018 2348   BILITOT 0.6 04/17/2018 2348   GFRNONAA 57 (L) 04/18/2018 0522   GFRAA >60 04/18/2018 0522    Lipid Panel     Component Value Date/Time   CHOL 244 (H) 04/18/2018 0522   TRIG 161 (H) 04/18/2018 0522   HDL 48 04/18/2018 0522   CHOLHDL 5.1 04/18/2018 0522   VLDL 32 04/18/2018 0522   LDLCALC 164 (H) 04/18/2018 0522     Imaging I have reviewed the images obtained:  CT-scan of the brain--New/larger infarcts in the posterior right temporal lobe and posterior right insula. No hemorrhage.  ASPECTS is 8.  Evolving early subacute infarcts in the right basal ganglia and  white matter as shown on recent prior MRI.  CT angios of head and neck/CT perfusion--findings consistent with subacute right MCA infarct affecting the posterior right temporal lobe and parietal white matter.  Right MCA M1 severe stenosis versus recent thromboembolic near occlusion without improvement since 04/18/2018, but no interval branch occlusion.   Subsequent prolonged perfusion time through the right MCA territory, with the worst T-max area corresponding to the subacute infarct.  No new stenosis, but no other notable atherosclerotic stenosis -Left CCA origin up to 60% - Left ICA bulb up to 65%   NEUROHOSPITALIST ADDENDUM Performed a face to face diagnostic evaluation. Patient presented with facial droop after a fall earlier this morning.  She is also confused.  CT head showed expansion of her recent right MCA stroke.   I have reviewed the contents of history and physical exam as documented by PA/ARNP/Resident and agree with above documentation.  I have discussed and formulated the plan, with edits made below.      ASSESSMENT AND PLAN  84 year old female with history of hyperlipidemia, recent right  MCA infarct in the setting of right MCA m1 segment stenosis vs partial occlusion presents to the emergency room after having a fall at 8 AM and noted to have facial droop and confused/not speaking for short period of time.  EMS stroke alerted the patient.  CT head showed extension of prior infarct, likely occurred in the last 24 to 48 hours.  On initial examination there was no neglect, however there was some fluctuation of symptoms shortly after CT scan.  This improved on laying patient flat.  You performed a CT perfusion which showed reduced perfusion or already infarcted core noted on CT, however at a lower threshold showed a oligemia over most of right hemisphere.  Patient not a TPA candidate due to minimal symptoms along with recent CVA. Not a candidate for emergent thrombectomy she does not have acute occlusion.   Impression:  #Expansion of previous right MCA territory stroke in the setting of partial occlusion versus stenosis of right M1 segment of middle cerebral artery  #Atrial fibrillation with RVR  Recommendations: # MRI Brain : shows expansion of stroke in R MCA territory, corresponding to findings on CT scan #Maintain  permissive blood pressure, avoid hypotension (MAP less than 90)  #Maintain bed flat -25 degrees # Continue ASA, plavix- start AC in 3-5 days #May Consider heparin drip if there is fluctuation of symptoms,Inform neurologist if NIH stroke scale changes greater than 5 points #Every 2hr  neurochecks #Stroke swallow screen    Joeziah Voit MD [facility] [phone]   If 7pm to 7am, please call on call as listed on AMION.

## 2018-04-22 NOTE — ED Notes (Signed)
Patient returned to room from CT. 

## 2018-04-22 NOTE — H&P (Signed)
Date: 04/22/2018               Patient Name:  Katelyn Mckinney MRN: [id number]  DOB: 02-20-1926 Age / Sex: 84 y.o., female   PCP: Aniceto Boss, MD         Medical Service: Internal Medicine Teaching Service         Attending Physician: Dr. Doneen Poisson, MD    First Contact: Dr. Karilyn Cota, Evonna Stoltz Pager: [phone]  Second Contact: Dr. Levora Dredge Pager: [phone]       After Hours (After 5p/  First Contact Pager: [phone]  weekends / holidays): Second Contact Pager: [phone]   Chief Complaint: Code Stroke  History of Present Illness:   Katelyn Mckinney is a 84 y.o female with a PMHx of CVAs, HTN, and HLD presenting with slurred speech, facial droop and left sided weakness. Patient was recently discharged from [facility] with right MCA territory CVA on dual antiplatelet therapy and ezetimibe. Patient's daughter took her back to [place] and was staying with her to assist with ADL's. She states that the patient has not been ambulating much besides from the bedside to the bathroom with assistance. She noticed the patient to be more confused yesterday compared to her usual; this morning daughter heard a "thump" and found patient on the floor with left sided facial droop, drooling along with left sided weakness; she required assistance of 3 people to get up. Daughter also noticed she has been weaker than her baseline.   Patient endorses left sided weakness with left facial droop and drooling; she denies numbness or tingling; she denies vision or hearing changes, chest pain, chest tightness, chest pounding, or SOB. She reported a posterior occipital headache yesterday that was the same in intensity as her last admission. Patient denies constipation, urinary symptoms.  On arrival to the ED, code stroke was called; however, she was not considered a tPa candidate due to recent stroke. BP was 152/13, pulse 126, RR 18. CT head showed new larger infarct in the posterior right temporal lobe and posterior right  insula, no hemorrhage. EKG illustrated atrial fibrillation with rvr.     Meds:  Current Meds  Medication Sig  . aspirin EC 325 MG EC tablet Take 1 tablet (325 mg total) by mouth daily.  . clopidogrel (PLAVIX) 75 MG tablet Take 75 mg by mouth daily.  Marland Kitchen ezetimibe (ZETIA) 10 MG tablet Take 1 tablet (10 mg total) by mouth daily.  Marland Kitchen LORazepam (ATIVAN) 0.5 MG tablet Take 0.5 mg by mouth at bedtime.  . meclizine (ANTIVERT) 25 MG tablet Take 25 mg by mouth 3 (three) times daily as needed for dizziness.   . ranitidine (ZANTAC) 75 MG tablet Take 75 mg by mouth daily as needed for heartburn.  . traMADol (ULTRAM) 50 MG tablet Take 50 mg by mouth every 6 (six) hours as needed for moderate pain.   Allergies: Allergies as of 04/22/2018 - Review Complete 04/22/2018  Allergen Reaction Noted  . Latex Rash 04/17/2018   Past Medical History:  Diagnosis Date  . CVA (cerebral vascular accident) (HCC)   . GERD (gastroesophageal reflux disease)   . Hyperlipidemia     Family History:   No family history on file.  Social History:   Social History   Socioeconomic History  . Marital status: Widowed    Spouse name: Not on file  . Number of children: Not on file  . Years of education: Not on file  . Highest education level: Not  on file  Occupational History  . Not on file  Social Needs  . Financial resource strain: Not on file  . Food insecurity:    Worry: Not on file    Inability: Not on file  . Transportation needs:    Medical: Not on file    Non-medical: Not on file  Tobacco Use  . Smoking status: Not on file  Substance and Sexual Activity  . Alcohol use: Not on file  . Drug use: Not on file  . Sexual activity: Not on file  Lifestyle  . Physical activity:    Days per week: Not on file    Minutes per session: Not on file  . Stress: Not on file  Relationships  . Social connections:    Talks on phone: Not on file    Gets together: Not on file    Attends religious service: Not on  file    Active member of club or organization: Not on file    Attends meetings of clubs or organizations: Not on file    Relationship status: Not on file  . Intimate partner violence:    Fear of current or ex partner: Not on file    Emotionally abused: Not on file    Physically abused: Not on file    Forced sexual activity: Not on file  Other Topics Concern  . Not on file  Social History Narrative  . Not on file    Review of Systems: A complete ROS was negative except as per HPI.   Physical Exam: Blood pressure (!) 123/103, pulse (!) 139, resp. rate 14, SpO2 98 %.  Physical Exam  Constitutional: She is well-developed, well-nourished, and in no distress.  Cardiovascular: irregularly irregular rhythm Lungs: effort normal, no respiratory distress Extremities: no edema Neurological: alert and oriented to person, not oriented to time or city, left sided facial droop, left shoulder shrug weakness, left sided UE and LE weakness 3/5, right UE and LE 4/5  EKG: personally reviewed my interpretation is atrial fibrillation with rvr  CXR: n/a  Assessment & Plan by Problem: Active Problems:   Stroke [facility])  Katelyn Mckinney is a 84 y.o female with a PMHx of CVAs, HTN, and HLD presenting with slurred speech, left sided facial droop and right sided weakness. Patient was recently discharged from [facility] with right MCA territory CVA on dual antiplatelet therapy and ezetimibe.  Acute CVA Patient presented with slurred speech, left sided facial droop, and left sided weakness. She recently had a right MCA territory CVA on 04/18/18. She was discharged on DAPT and ezetimibe- due to intolerance of statin therapy. On admission, CT showed new/larger infarcts in the posterior right temporal lobe and posterior right insula, no hemorrhage. She was not a tpa candidate due to recent stroke. EKG illustrated atrial fibrillation with RVR. On exam, slurred speech improved, left sided facial droop and left sided weakness  still present. She was alert and oriented to person, not oriented to time or city. Passed swallow screen. Most likely an extension of her previous stroke due to embolism from atrial fibrillation, per neuro.  -- maintain normotensive blood pressures, per neuro -- maintain bed flat -25 degrees, per neuro -- continue aspirin 325 mg, clopidogrel 75 mg and ezetimibe 10 mg QD -- PT/OT and speech consulted  -- f/u MRI head  A fib with RVR EKG illustrated atrial fibrillation with RVR. Recent echo during last admission showed LV EF 60-65%, normal systolic function, aortic valve sclerosis, mitral valve calcified annulus  with moderately thickened/calcified leaflets. Likely non-valvular atrial fibrillation. Started metoprolol x1. INR 1.12. CHA2DS22VASc score 7, moderate-high risk for stroke, should otherwise be on anticoagulation. HAS-BLEED score 3, high risk for major bleeding. Will plan to start anticoagulation treatment in ~2-5 days due to risk of hemorrhagic conversion. If patient has fluctuating symptoms and MRI does not show hemorrhage, consider heparin drip. Rate controlled well with one dose of metoprolol. Will continue to monitor rate; control without decreasing blood pressure. -- metoprolol 5mg  IV once -- continue telemetry   Hyperlipidemia Patient does not tolerate statin therapy, reports GI side effects. Started on ezetimibe during her last admission. -- continue ezetimibe 10 mg QD  HTN -- maintain normotensive blood pressures, per neuro -- continuing to hold home medication atenolol 100 mg QD  Diet: Heart healthy Code: DNR VTE Prophylaxis: lovenox   Dispo: Admit patient to Inpatient with expected length of stay greater than 2 midnights.  SignedJaci Standard: Natanel Snavely N, DO 04/22/2018, 1:54 PM  Pager: [phone]

## 2018-04-22 NOTE — ED Notes (Signed)
Patient's daughter states patient was last seen normal at 10:30pm last night. At 8am this morning, daughter heard a thud and found patient sitting on floor, which was when she first noticed patient was exhibiting R sided facial droop and was drooling. Per family, her right side at the time appeared weak as well, but no drift noted in any extremities.

## 2018-04-22 NOTE — Progress Notes (Signed)
New Admission Note:  Arrival Method: on stretcher from ED Mental Orientation: alert & oriented x 3 Telemetry:M08 Assessment: Completed Skin: pt has an abrasion on L leg, bruising on L shoulder on back.  IV: R AC/ saline locked  Pain:0/10 Safety Measures: Safety Fall Prevention Plan was given, discussed. Admission: Completed 3W26: Patient has been orientated to the room, unit and the staff. Family: daughter at bedside  Orders have been reviewed and implemented. Will continue to monitor the patient. Call light has been placed within reach and bed alarm has been activated.   Lawernce IonYari Edu On ,RN

## 2018-04-22 NOTE — ED Notes (Signed)
Pt placed on pur wick

## 2018-04-22 NOTE — ED Notes (Signed)
MD at bedside. 

## 2018-04-22 NOTE — ED Provider Notes (Signed)
[facility] EMERGENCY DEPARTMENT Provider Note   CSN: [id number] Arrival date & time: 04/22/18  4098   An emergency department physician performed an initial assessment on this suspected stroke patient at 73.  History   Chief Complaint Chief Complaint  Patient presents with  . Facial Droop  . Code Stroke    HPI Katelyn Mckinney is a 84 y.o. female.  HPI Patient has past medical history of CVA being discharged from the hospital 3 days ago.  That was right MCA territory.  She is on dual antiplatelet therapy.  Patient's daughter reports that she heard a thud in the bathroom and found the patient on the floor drooling.  She was weak and poorly responsive.  The patient has chronically been having weakness and requiring significant amount of assistance since the stroke.  Patient's daughter observed that the left-sided facial droop seemed to be worse at this time.  She however reports that the degree of weakness has been both generalized and the persistent left-sided weakness that the patient has had since her stroke.  Patient was supposed to be getting a loop recorder next week.  Patient denies she has any chest pain or headache.  She reports she is just felt extremely weak.  Patient's daughter reports that she did complain of a headache yesterday. Past Medical History:  Diagnosis Date  . Atrial fibrillation (HCC) 04/22/2018  . CVA (cerebral vascular accident) (HCC)   . Diverticulitis   . GERD (gastroesophageal reflux disease)   . Hyperlipidemia   . Hypertension     Patient Active Problem List   Diagnosis Date Noted  . Stroke (HCC) 04/22/2018  . Acute ischemic right MCA stroke (HCC)   . Essential hypertension   . Pure hypercholesterolemia   . Solitary pulmonary nodule   . CVA (cerebral vascular accident) (HCC) 04/18/2018    Past Surgical History:  Procedure Laterality Date  . CARPAL TUNNEL RELEASE    . CATARACT EXTRACTION, BILATERAL    . HEMORROIDECTOMY      . KNEE ARTHROPLASTY       OB History   None      Home Medications    Prior to Admission medications   Medication Sig Start Date End Date Taking? Authorizing Provider  aspirin EC 325 MG EC tablet Take 1 tablet (325 mg total) by mouth daily. 04/19/18  Yes Marvel Plan, MD  clopidogrel (PLAVIX) 75 MG tablet Take 75 mg by mouth daily. 03/07/18  Yes [provider]  ezetimibe (ZETIA) 10 MG tablet Take 1 tablet (10 mg total) by mouth daily. 04/20/18  Yes Rehman, Areeg N, DO  LORazepam (ATIVAN) 0.5 MG tablet Take 0.5 mg by mouth at bedtime. 02/22/18  Yes [provider]  meclizine (ANTIVERT) 25 MG tablet Take 25 mg by mouth 3 (three) times daily as needed for dizziness.  01/16/18  Yes [provider]  ranitidine (ZANTAC) 75 MG tablet Take 75 mg by mouth daily as needed for heartburn.   Yes [provider]  traMADol (ULTRAM) 50 MG tablet Take 50 mg by mouth every 6 (six) hours as needed for moderate pain.   Yes [provider]    Family History History reviewed. No pertinent family history.  Social History Social History   Tobacco Use  . Smoking status: Former Games developer  . Smokeless tobacco: Never Used  . Tobacco comment: LIGHT SMOKER OVER 30 YEARS AGO  Substance Use Topics  . Alcohol use: Never    Frequency: Never  .  Drug use: Never     Allergies   Latex   Review of Systems Review of Systems 10 Systems reviewed and are negative for acute change except as noted in the HPI.   Physical Exam Updated Vital Signs BP 140/64 (BP Location: Left Arm)   Pulse 86   Temp 98.7 F (37.1 C) (Axillary)   Resp 17   SpO2 95%   Physical Exam  Constitutional:  Patient is elderly female appears fatigued and weak.  Respiratory distress.  Airway patent.  HENT:  Head: Normocephalic and atraumatic.  Mouth/Throat: Oropharynx is clear and moist.  Eyes: Pupils are equal, round, and reactive to light.  Patient seems to have a right gaze preference.   However with encouragement she does gaze past midline to the left.  Neck: Neck supple.  Cardiovascular:  Tachycardia.  Irregularly irregular.  Pulmonary/Chest:  No respiratory distress.  Lungs clear without wheeze rhonchi or rale.  Abdominal: Soft. She exhibits no distension. There is no tenderness. There is no guarding.  Musculoskeletal: Normal range of motion. She exhibits no tenderness or deformity.  Neurological:  On arrival, patient seems mildly somnolent but opens eyes and follows most commands.  She seems delayed in responses.  She appears to have a left-sided neglect.  If I placed my fingers in her hand she does not acknowledge in for squeezing.  If I gave her the verbal cue to squeeze she follows through.  Same with left lower extremity.  With touch cues patient cannot respond, but with verbal instructions she will perform left lower extremity testing.  Skin: Skin is warm and dry.  Psychiatric: She has a normal mood and affect.     ED Treatments / Results  Labs (all labs ordered are listed, but only abnormal results are displayed) Labs Reviewed  COMPREHENSIVE METABOLIC PANEL - Abnormal; Notable for the following components:      Result Value   Glucose, Bld 142 (*)    Creatinine, Ser 1.02 (*)    Total Protein 6.3 (*)    Albumin 3.4 (*)    GFR calc non Af Amer 47 (*)    GFR calc Af Amer 55 (*)    All other components within normal limits  I-STAT CHEM 8, ED - Abnormal; Notable for the following components:   Glucose, Bld 142 (*)    All other components within normal limits  PROTIME-INR  APTT  CBC  DIFFERENTIAL  I-STAT TROPONIN, ED    EKG EKG Interpretation  Date/Time:  Thursday April 22 2018 10:24:35 EDT Ventricular Rate:  133 PR Interval:    QRS Duration: 92 QT Interval:  323 QTC Calculation: 477 R Axis:   80 Text Interpretation:  Atrial fibrillation Abnormal R-wave progression, early transition Inferior infarct, old ST depression, probably rate related  Baseline wander in lead(s) I III aVR aVL aVF V1 V2 V3 V4 V5 V6 agree. afib new from previous EKG. Confirmed by Arby Barrette [phone]) on 04/23/2018 9:30:28 AM   Radiology Ct Angio Head W Or Wo Contrast  Result Date: 04/22/2018 CLINICAL DATA:  84 year old female Code stroke. Slurred speech and facial droop. EXAM: CT ANGIOGRAPHY HEAD AND NECK CT PERFUSION BRAIN TECHNIQUE: Multidetector CT imaging of the head and neck was performed using the standard protocol during bolus administration of intravenous contrast. Multiplanar CT image reconstructions and MIPs were obtained to evaluate the vascular anatomy. Carotid stenosis measurements (when applicable) are obtained utilizing NASCET criteria, using the distal internal carotid diameter as the denominator. Multiphase CT imaging  of the brain was performed following IV bolus contrast injection. Subsequent parametric perfusion maps were calculated using RAPID software. CONTRAST:  ISOVUE-370 IOPAMIDOL (ISOVUE-370) INJECTION 76%, <See Chart> ISOVUE-370 IOPAMIDOL (ISOVUE-370) INJECTION 76% COMPARISON:  Head CT without contrast 1014 hours today. Brain MRI and CTA head and neck 04/18/2018. FINDINGS: CT Brain Perfusion Findings: CBF (<30%) Volume: None Perfusion (Tmax>6.0s) volume: 11mL, this primarily corresponds to the newly abnormal hypodense area seen on CT at 1014 hours today. Mismatch Volume: 11mL Infarction Location:No core infarct, but abnormal mean transit time in the right hemisphere, worst in the posterior right MCA territory. CTA NECK Skeleton: No acute osseous abnormality identified. Upper chest: Negative visible upper lungs. No mediastinal lymphadenopathy. Other neck: Negative aside from a retropharyngeal course of both carotid arteries. No neck mass or lymphadenopathy. Aortic arch: Extensive Calcified aortic atherosclerosis. 3 vessel arch configuration. Right carotid system: Stable brachiocephalic and right CCA origins with no stenosis despite plaque. Soft  and calcified plaque at the right ICA origin and bulb with less than 50 % stenosis with respect to the distal vessel. Tortuous cervical right ICA with no additional stenosis to the skull base. Left carotid system: Stable left CCA origin soft and calcified plaque with 50-60% stenosis with respect to the distal vessel. Soft and calcified plaque at the left carotid bifurcation, left ICA origin and bulb resulting in estimated 65% stenosis with respect to the distal vessel (series 7, image 193 at the bulb). The left ICA is tortuous just below the skull base without additional stenosis. Vertebral arteries: Proximal right subclavian artery and proximal right vertebral artery plaque without hemodynamically significant stenosis. Similar proximal V2 segment and mid V3 segment plaque without significant stenosis to the skull base. No proximal left subclavian artery stenosis despite soft and calcified plaque. Normal left vertebral artery origin. Tortuous left vertebral artery without stenosis to the skull base. CTA HEAD Posterior circulation: No distal vertebral artery stenosis. Patent PICA origins and vertebrobasilar junction. Patent basilar artery without stenosis. SCA and PCA origins remain patent. Stable mild to moderate bilateral PCA P1 P2 segment irregularity and stenosis. Preserved distal PCA enhancement. Anterior circulation: Both ICA siphons remain patent. Moderate calcified plaque re-demonstrated in both siphons with mild left but at least moderate right supraclinoid ICA stenosis. Patent MCA and ACA origins. Stable moderate left A1 segment stenosis. Anterior communicating artery and bilateral ACA branches remain within normal limits. Left MCA M1 segment, bifurcation, and left MCA branches are stable and within normal limits. Continued severe irregularity and stenosis of the right MCA mid M1 segment. Compared to 04/18/2018, asymmetrically diminutive and mildly to moderately irregular right MCA branches are stable and  patent. No progression identified since the 825 CTA. Venous sinuses: Patent. Anatomic variants: Mildly dominant left vertebral artery. Review of the MIP images confirms the above findings IMPRESSION: 1. Constellation of plain CT, CT Perfusion, and recent MRI findings consistent with a Subacute Right MCA infarct affecting the posterior right temporal lobe and periatrial white matter. 2. Right MCA M1 severe stenosis versus recent thrombo-embolic near occlusion without improvement since the 04/18/2018 CTA, but no interval branch occlusion. 3. Subsequent prolonged perfusion time throughout the Right MCA territory, with the worst Tmax area corresponding to the subacute infarct. 4. Stable arterial findings on CTA head and neck elsewhere, no new stenosis, but other notable atherosclerotic stenosis: - supraclinoid Right ICA siphon, moderate. - Left CCA origin up to 60%. - Left ICA bulb up to 65%. Salient findings were discussed with Dr. Laurence Slate at 11:47 amon 04/22/2018  by telephone. Electronically Signed   By: Odessa Fleming M.D.   On: 04/22/2018 11:47   Ct Angio Neck W Or Wo Contrast  Result Date: 04/22/2018 CLINICAL DATA:  84 year old female Code stroke. Slurred speech and facial droop. EXAM: CT ANGIOGRAPHY HEAD AND NECK CT PERFUSION BRAIN TECHNIQUE: Multidetector CT imaging of the head and neck was performed using the standard protocol during bolus administration of intravenous contrast. Multiplanar CT image reconstructions and MIPs were obtained to evaluate the vascular anatomy. Carotid stenosis measurements (when applicable) are obtained utilizing NASCET criteria, using the distal internal carotid diameter as the denominator. Multiphase CT imaging of the brain was performed following IV bolus contrast injection. Subsequent parametric perfusion maps were calculated using RAPID software. CONTRAST:  ISOVUE-370 IOPAMIDOL (ISOVUE-370) INJECTION 76%, <See Chart> ISOVUE-370 IOPAMIDOL (ISOVUE-370) INJECTION 76% COMPARISON:   Head CT without contrast 1014 hours today. Brain MRI and CTA head and neck 04/18/2018. FINDINGS: CT Brain Perfusion Findings: CBF (<30%) Volume: None Perfusion (Tmax>6.0s) volume: 11mL, this primarily corresponds to the newly abnormal hypodense area seen on CT at 1014 hours today. Mismatch Volume: 11mL Infarction Location:No core infarct, but abnormal mean transit time in the right hemisphere, worst in the posterior right MCA territory. CTA NECK Skeleton: No acute osseous abnormality identified. Upper chest: Negative visible upper lungs. No mediastinal lymphadenopathy. Other neck: Negative aside from a retropharyngeal course of both carotid arteries. No neck mass or lymphadenopathy. Aortic arch: Extensive Calcified aortic atherosclerosis. 3 vessel arch configuration. Right carotid system: Stable brachiocephalic and right CCA origins with no stenosis despite plaque. Soft and calcified plaque at the right ICA origin and bulb with less than 50 % stenosis with respect to the distal vessel. Tortuous cervical right ICA with no additional stenosis to the skull base. Left carotid system: Stable left CCA origin soft and calcified plaque with 50-60% stenosis with respect to the distal vessel. Soft and calcified plaque at the left carotid bifurcation, left ICA origin and bulb resulting in estimated 65% stenosis with respect to the distal vessel (series 7, image 193 at the bulb). The left ICA is tortuous just below the skull base without additional stenosis. Vertebral arteries: Proximal right subclavian artery and proximal right vertebral artery plaque without hemodynamically significant stenosis. Similar proximal V2 segment and mid V3 segment plaque without significant stenosis to the skull base. No proximal left subclavian artery stenosis despite soft and calcified plaque. Normal left vertebral artery origin. Tortuous left vertebral artery without stenosis to the skull base. CTA HEAD Posterior circulation: No distal vertebral  artery stenosis. Patent PICA origins and vertebrobasilar junction. Patent basilar artery without stenosis. SCA and PCA origins remain patent. Stable mild to moderate bilateral PCA P1 P2 segment irregularity and stenosis. Preserved distal PCA enhancement. Anterior circulation: Both ICA siphons remain patent. Moderate calcified plaque re-demonstrated in both siphons with mild left but at least moderate right supraclinoid ICA stenosis. Patent MCA and ACA origins. Stable moderate left A1 segment stenosis. Anterior communicating artery and bilateral ACA branches remain within normal limits. Left MCA M1 segment, bifurcation, and left MCA branches are stable and within normal limits. Continued severe irregularity and stenosis of the right MCA mid M1 segment. Compared to 04/18/2018, asymmetrically diminutive and mildly to moderately irregular right MCA branches are stable and patent. No progression identified since the 825 CTA. Venous sinuses: Patent. Anatomic variants: Mildly dominant left vertebral artery. Review of the MIP images confirms the above findings IMPRESSION: 1. Constellation of plain CT, CT Perfusion, and recent MRI findings  consistent with a Subacute Right MCA infarct affecting the posterior right temporal lobe and periatrial white matter. 2. Right MCA M1 severe stenosis versus recent thrombo-embolic near occlusion without improvement since the 04/18/2018 CTA, but no interval branch occlusion. 3. Subsequent prolonged perfusion time throughout the Right MCA territory, with the worst Tmax area corresponding to the subacute infarct. 4. Stable arterial findings on CTA head and neck elsewhere, no new stenosis, but other notable atherosclerotic stenosis: - supraclinoid Right ICA siphon, moderate. - Left CCA origin up to 60%. - Left ICA bulb up to 65%. Salient findings were discussed with Dr. Laurence Slate at 11:47 amon 04/22/2018 by telephone. Electronically Signed   By: Odessa Fleming M.D.   On: 04/22/2018 11:47   Mr Brain Wo  Contrast  Result Date: 04/22/2018 CLINICAL DATA:  Initial evaluation for acute left-sided weakness, concern for stroke. Patient with history of recent right MCA territory watershed strokes with known severe right M1 stenosis EXAM: MRI HEAD WITHOUT CONTRAST TECHNIQUE: Multiplanar, multiecho pulse sequences of the brain and surrounding structures were obtained without intravenous contrast. COMPARISON:  Comparison made with prior CTA and CT perfusion from earlier same day as well as multiple previous exams from 04/18/2018. FINDINGS: Brain: There has been interval worsening in multiple right MCA territory infarcts, with multiple new/larger infarcts seen as compared to recent MRI from 04/18/2018. These areas involve the periventricular/deep white matter of the right centrum semi ovale and corona radiata, with extension into the right basal ganglia. Larger confluent area of restricted diffusion now seen within the right periatrial white matter, extending into the right temporal lobe. Increased patchy involvement at the right insula/subinsular region. Areas of infarction again positioned within a somewhat watershed distribution. No associated hemorrhage or mass effect. No other areas of acute infarct. Gray-white matter differentiation otherwise maintained. No acute or chronic intracranial hemorrhage. Underlying atrophy with chronic small vessel ischemic disease again noted. Chronic left thalamic lacunar infarct noted. No mass lesion, midline shift, or mass effect. No hydrocephalus. No extra-axial fluid collection. Vascular: Flow voids within the distal right MCA branches attenuated as compared to the left, likely related to known right M1 stenosis. Major intracranial vascular flow voids otherwise maintained. Skull and upper cervical spine: Craniocervical junction within normal limits. No focal marrow replacing lesion. Scalp soft tissues unremarkable. Sinuses/Orbits: Patient status post ocular lens replacement  bilaterally. Paranasal sinuses are clear. Small right mastoid effusion noted. Inner ear structures normal. Other: None. IMPRESSION: 1. Interval worsening in multiple right MCA territory infarcts, with multiple new/larger infarcts involving the right MCA distribution as above. Infarcts primarily watershed in nature. No associated hemorrhage. 2. Otherwise stable appearance of the brain. No other acute intracranial abnormality identified. Electronically Signed   By: Rise Mu M.D.   On: 04/22/2018 17:33   Ct Cerebral Perfusion W Contrast  Result Date: 04/22/2018 CLINICAL DATA:  84 year old female Code stroke. Slurred speech and facial droop. EXAM: CT ANGIOGRAPHY HEAD AND NECK CT PERFUSION BRAIN TECHNIQUE: Multidetector CT imaging of the head and neck was performed using the standard protocol during bolus administration of intravenous contrast. Multiplanar CT image reconstructions and MIPs were obtained to evaluate the vascular anatomy. Carotid stenosis measurements (when applicable) are obtained utilizing NASCET criteria, using the distal internal carotid diameter as the denominator. Multiphase CT imaging of the brain was performed following IV bolus contrast injection. Subsequent parametric perfusion maps were calculated using RAPID software. CONTRAST:  ISOVUE-370 IOPAMIDOL (ISOVUE-370) INJECTION 76%, <See Chart> ISOVUE-370 IOPAMIDOL (ISOVUE-370) INJECTION 76% COMPARISON:  Head  CT without contrast 1014 hours today. Brain MRI and CTA head and neck 04/18/2018. FINDINGS: CT Brain Perfusion Findings: CBF (<30%) Volume: None Perfusion (Tmax>6.0s) volume: 11mL, this primarily corresponds to the newly abnormal hypodense area seen on CT at 1014 hours today. Mismatch Volume: 11mL Infarction Location:No core infarct, but abnormal mean transit time in the right hemisphere, worst in the posterior right MCA territory. CTA NECK Skeleton: No acute osseous abnormality identified. Upper chest: Negative visible  upper lungs. No mediastinal lymphadenopathy. Other neck: Negative aside from a retropharyngeal course of both carotid arteries. No neck mass or lymphadenopathy. Aortic arch: Extensive Calcified aortic atherosclerosis. 3 vessel arch configuration. Right carotid system: Stable brachiocephalic and right CCA origins with no stenosis despite plaque. Soft and calcified plaque at the right ICA origin and bulb with less than 50 % stenosis with respect to the distal vessel. Tortuous cervical right ICA with no additional stenosis to the skull base. Left carotid system: Stable left CCA origin soft and calcified plaque with 50-60% stenosis with respect to the distal vessel. Soft and calcified plaque at the left carotid bifurcation, left ICA origin and bulb resulting in estimated 65% stenosis with respect to the distal vessel (series 7, image 193 at the bulb). The left ICA is tortuous just below the skull base without additional stenosis. Vertebral arteries: Proximal right subclavian artery and proximal right vertebral artery plaque without hemodynamically significant stenosis. Similar proximal V2 segment and mid V3 segment plaque without significant stenosis to the skull base. No proximal left subclavian artery stenosis despite soft and calcified plaque. Normal left vertebral artery origin. Tortuous left vertebral artery without stenosis to the skull base. CTA HEAD Posterior circulation: No distal vertebral artery stenosis. Patent PICA origins and vertebrobasilar junction. Patent basilar artery without stenosis. SCA and PCA origins remain patent. Stable mild to moderate bilateral PCA P1 P2 segment irregularity and stenosis. Preserved distal PCA enhancement. Anterior circulation: Both ICA siphons remain patent. Moderate calcified plaque re-demonstrated in both siphons with mild left but at least moderate right supraclinoid ICA stenosis. Patent MCA and ACA origins. Stable moderate left A1 segment stenosis. Anterior communicating  artery and bilateral ACA branches remain within normal limits. Left MCA M1 segment, bifurcation, and left MCA branches are stable and within normal limits. Continued severe irregularity and stenosis of the right MCA mid M1 segment. Compared to 04/18/2018, asymmetrically diminutive and mildly to moderately irregular right MCA branches are stable and patent. No progression identified since the 825 CTA. Venous sinuses: Patent. Anatomic variants: Mildly dominant left vertebral artery. Review of the MIP images confirms the above findings IMPRESSION: 1. Constellation of plain CT, CT Perfusion, and recent MRI findings consistent with a Subacute Right MCA infarct affecting the posterior right temporal lobe and periatrial white matter. 2. Right MCA M1 severe stenosis versus recent thrombo-embolic near occlusion without improvement since the 04/18/2018 CTA, but no interval branch occlusion. 3. Subsequent prolonged perfusion time throughout the Right MCA territory, with the worst Tmax area corresponding to the subacute infarct. 4. Stable arterial findings on CTA head and neck elsewhere, no new stenosis, but other notable atherosclerotic stenosis: - supraclinoid Right ICA siphon, moderate. - Left CCA origin up to 60%. - Left ICA bulb up to 65%. Salient findings were discussed with Dr. Laurence SlateAroor at 11:47 amon 04/22/2018 by telephone. Electronically Signed   By: Odessa FlemingH  Hall M.D.   On: 04/22/2018 11:47   Ct Head Code Stroke Wo Contrast  Result Date: 04/22/2018 CLINICAL DATA:  Code stroke.  Slurred speech and  facial droop. EXAM: CT HEAD WITHOUT CONTRAST TECHNIQUE: Contiguous axial images were obtained from the base of the skull through the vertex without intravenous contrast. COMPARISON:  Head CT and MRI 04/18/2018 FINDINGS: Brain: A 3 cm region of low density involving cortex and white matter in the posterior right temporal lobe/temporoparietal junction region is new from the prior CT, and there were only a few small punctate foci of  acute infarction in this location on the MRI. There is also involvement of the posterior right insula which appears greater than on the prior MRI. Small hypodensities in the right corona radiata and right basal ganglia correspond to acute infarcts on the prior MRI. No intracranial hemorrhage, mass, midline shift, or extra-axial fluid collection is identified. A chronic lacunar infarct is again seen in the left thalamus, and there is mild chronic small vessel ischemia in the cerebral white matter bilaterally. Mild cerebral atrophy is noted. Vascular: Calcified atherosclerosis at the skull base. No hyperdense vessel. Skull: No fracture or focal osseous lesion. Sinuses/Orbits: Visualized paranasal sinuses are clear. There is mild chronic right inferior mastoid air cell opacification. Bilateral cataract extraction. Other: None. ASPECTS ([place] Stroke Program Early CT Score) - Ganglionic level infarction (caudate, lentiform nuclei, internal capsule, insula, M1-M3 cortex): 5 - Supraganglionic infarction (M4-M6 cortex): 3 Total score (0-10 with 10 being normal): 8 IMPRESSION: 1. New/larger infarcts in the posterior right temporal lobe and posterior right insula. No hemorrhage. 2. ASPECTS is 8. 3. Evolving early subacute infarcts in the right basal ganglia and white matter as shown on recent prior MRI. These results were communicated to Dr. Laurence Slate at 10:22 am on 04/22/2018 by text page via the [facility] messaging system. Electronically Signed   By: Sebastian Ache M.D.   On: 04/22/2018 10:26    Procedures Procedures (including critical care time) CRITICAL CARE Performed by: Arby Barrette   Total critical care time: 30  minutes  Critical care time was exclusive of separately billable procedures and treating other patients.  Critical care was necessary to treat or prevent imminent or life-threatening deterioration.  Critical care was time spent personally by me on the following activities: development of treatment  plan with patient and/or surrogate as well as nursing, discussions with consultants, evaluation of patient's response to treatment, examination of patient, obtaining history from patient or surrogate, ordering and performing treatments and interventions, ordering and review of laboratory studies, ordering and review of radiographic studies, pulse oximetry and re-evaluation of patient's condition. Medications Ordered in ED Medications  iopamidol (ISOVUE-370) 76 % injection (has no administration in time range)  aspirin EC tablet 325 mg (325 mg Oral Not Given 04/22/18 1815)  ezetimibe (ZETIA) tablet 10 mg (10 mg Oral Given 04/22/18 1805)  LORazepam (ATIVAN) tablet 0.5 mg (0.5 mg Oral Given 04/22/18 2134)  famotidine (PEPCID) tablet 10 mg (has no administration in time range)  clopidogrel (PLAVIX) tablet 75 mg (75 mg Oral Given 04/22/18 1805)   stroke: mapping our early stages of recovery book (has no administration in time range)  acetaminophen (TYLENOL) tablet 650 mg (650 mg Oral Given 04/23/18 0523)    Or  acetaminophen (TYLENOL) solution 650 mg ( Per Tube See Alternative 04/23/18 0523)    Or  acetaminophen (TYLENOL) suppository 650 mg ( Rectal See Alternative 04/23/18 0523)  senna-docusate (Senokot-S) tablet 1 tablet (has no administration in time range)  enoxaparin (LOVENOX) injection 40 mg (40 mg Subcutaneous Given 04/22/18 2135)  sodium chloride 0.9 % bolus 500 mL (0 mLs Intravenous Stopped 04/22/18 1239)  iopamidol (ISOVUE-370) 76 % injection 100 mL (100 mLs Intravenous Contrast Given 04/22/18 1054)  metoprolol tartrate (LOPRESSOR) injection 5 mg (5 mg Intravenous Given 04/22/18 1452)  metoprolol tartrate (LOPRESSOR) injection 5 mg (5 mg Intravenous Given 04/22/18 1808)     Initial Impression / Assessment and Plan / ED Course  I have reviewed the triage vital signs and the nursing notes.  Pertinent labs & imaging results that were available during my care of the patient were reviewed by me and  considered in my medical decision making (see chart for details).     Consult:Dr. Aroor as code stroke.  Discussed patient's A. fib RVR.  Advises to try to maintain normotensive blood pressure. Consult: Internal medicine teaching service for admission.  (13: 45) Cardiology consult pending. Pending cardiology consult to discuss diltiazem versus amiodarone to maintain blood pressure due to risk of stroke exacerbation with hypotensive episode. Final Clinical Impressions(s) / ED Diagnoses   Final diagnoses:  Atrial fibrillation with RVR (HCC)  Cerebrovascular accident (CVA) due to embolism of right middle cerebral artery (HCC)   Patient's condition improved during the course of her stay in the emergency department.  After diagnostic evaluation and recheck just prior to admission, patient has become cheerful and more alert.  He is interacting normally with her daughter.  Patient presents with a stuttering, known right sided CVA.  She was evaluated by Dr. Cameron Ali as code stroke.  Patient was not a thrombolytic candidate.  Patient now has confirmed atrial fibrillation.  Likely she has an embolic source.  She is tolerating this well.  She has no dyspnea or clinical signs of CHF.  Patient will be admitted to internal medicine teaching service for ongoing management of CVA with A. fib RVR.  ED Discharge Orders    None       Arby Barrette, MD 04/23/18 [phone]

## 2018-04-22 NOTE — ED Triage Notes (Signed)
Pt presents with slurred speech and facial drop. Daughter reports hearing the patient fall at 0800 unsure if she hit her head, she noticed she had slurred speech and she was drooling. Hx of TIA. LKW at 0800. Takes plavix. Pt answers questions appropriately. Code stroke orders entered.

## 2018-04-22 NOTE — ED Notes (Signed)
MD made aware of fluctuating neuro changes and to re-assess (see NIH).

## 2018-04-22 NOTE — Code Documentation (Signed)
84 yo female coming from home where she lives with her daughter. Pt had stroke one week ago and was admitted to [facility]. Pt was discharged three days ago and family reports generalized weakness and memory loss. Last night, patient went to bed at her baseline after being discharged. This morning at 0800, daughter heard a thud and went in to find her mother on the ground drooling with some left sided weakness. Pt was brought in POV. Triage activated a Code Stroke. Stroke Team met patient in [place]. Initial NIHSS 4 per Dr. Lorraine Lax due to inability to answer question, slight facial droop, and slight slurred speech. CT Head completed with some mild changes noted. Patient brought back to Trauma B. Upon reassessment, pt was noted to have left visual cut, left neglect, mild aphasia and dysarthria. MD Aroor made aware. Pt was reassessed by neurology and taken back to CT for CTA/CTP. After exam, Pt decreased NIHSS to 5 after CTA/CTP was completed with no visual changes or neglect noted anymore. Aroor ordered to keep BP elevated and assess patient q2 hours. Pt is not a candidate for tPA due to Stroke last week. Not an IR candidate at this time.Handoff given to [place], Therapist, sports.

## 2018-04-23 ENCOUNTER — Ambulatory Visit: Payer: Self-pay

## 2018-04-23 DIAGNOSIS — G8192 Hemiplegia, unspecified affecting left dominant side: Secondary | ICD-10-CM

## 2018-04-23 DIAGNOSIS — I050 Rheumatic mitral stenosis: Secondary | ICD-10-CM

## 2018-04-23 DIAGNOSIS — E785 Hyperlipidemia, unspecified: Secondary | ICD-10-CM

## 2018-04-23 DIAGNOSIS — I342 Nonrheumatic mitral (valve) stenosis: Secondary | ICD-10-CM

## 2018-04-23 DIAGNOSIS — Z79899 Other long term (current) drug therapy: Secondary | ICD-10-CM

## 2018-04-23 DIAGNOSIS — I4891 Unspecified atrial fibrillation: Secondary | ICD-10-CM

## 2018-04-23 DIAGNOSIS — Z66 Do not resuscitate: Secondary | ICD-10-CM

## 2018-04-23 DIAGNOSIS — G8191 Hemiplegia, unspecified affecting right dominant side: Secondary | ICD-10-CM

## 2018-04-23 DIAGNOSIS — Z7982 Long term (current) use of aspirin: Secondary | ICD-10-CM

## 2018-04-23 DIAGNOSIS — Z8673 Personal history of transient ischemic attack (TIA), and cerebral infarction without residual deficits: Secondary | ICD-10-CM

## 2018-04-23 DIAGNOSIS — I10 Essential (primary) hypertension: Secondary | ICD-10-CM

## 2018-04-23 DIAGNOSIS — I480 Paroxysmal atrial fibrillation: Secondary | ICD-10-CM

## 2018-04-23 DIAGNOSIS — I63511 Cerebral infarction due to unspecified occlusion or stenosis of right middle cerebral artery: Secondary | ICD-10-CM

## 2018-04-23 DIAGNOSIS — R29810 Facial weakness: Secondary | ICD-10-CM

## 2018-04-23 DIAGNOSIS — I63411 Cerebral infarction due to embolism of right middle cerebral artery: Principal | ICD-10-CM

## 2018-04-23 DIAGNOSIS — Z9181 History of falling: Secondary | ICD-10-CM

## 2018-04-23 DIAGNOSIS — Z7902 Long term (current) use of antithrombotics/antiplatelets: Secondary | ICD-10-CM

## 2018-04-23 NOTE — Progress Notes (Signed)
OT Cancellation Note  Patient Details Name: Katelyn Mckinney MRN: [id number] DOB: [birth year]   Cancelled Treatment:    Reason Eval/Treat Not Completed: Patient not medically ready. Per neurology, pt to keep HOB at 25 degrees. Will follow up as appropriate/time allows  Katelyn Mckinney 04/23/2018, 9:45 AM   Sherryl MangesLaura Danessa Mckinney OTR/L Acute Rehabilitation Services Pager: [phone] Office: [phone]

## 2018-04-23 NOTE — Evaluation (Signed)
Physical Therapy Evaluation Patient Details Name: Katelyn Mckinney MRN: [id number] DOB: 1926-02-28 Today's Date: 04/23/2018   History of Present Illness  Pt is an 84 y/o female with a history of cerebrovascular accident of the right MCA territory last week, hypertension, and hyperlipidemia who presents after a fall with associated facial droop. Pt found to have an extension of a recent watershed CVA secondary to an M1 branch middle cerebral artery stenosis.    Clinical Impression  Pt presented supine in bed with HOB elevated, awake and willing to participate in therapy session. Prior to admission, pt reported that she was ambulating with min A and required some assistance with ADLs. Pt's sister present throughout session as well. Pt currently requires max A x2 for all functional mobility at this time. Pt presents with significant L sided weakness, L inattention and cognitive deficits (see below) which limit her independence with functional mobility. Pt would continue to benefit from skilled physical therapy services at this time while admitted and after d/c to address the below listed limitations in order to improve overall safety and independence with functional mobility.  Attempted to take orthostatic vitals per MD request; however, was only able to obtain supine and sitting BP: Supine = 131/74 Sitting initially = 119/68 Sitting at end of session after transfers = 111/91    Follow Up Recommendations SNF    Equipment Recommendations  None recommended by PT    Recommendations for Other Services       Precautions / Restrictions Precautions Precautions: Fall Precaution Comments: L inattention      Mobility  Bed Mobility Overal bed mobility: Needs Assistance Bed Mobility: Rolling;Sidelying to Sit Rolling: Max assist;+2 for physical assistance Sidelying to sit: Max assist;+2 for physical assistance       General bed mobility comments: increased time and effort, multimodal cueing  for sequencing and technique, heavy physical assist of two for all aspects  Transfers Overall transfer level: Needs assistance Equipment used: Rolling walker (2 wheeled) Transfers: Sit to/from [employer] Sit to Stand: Max assist;+2 physical assistance Stand pivot transfers: Max assist;+2 physical assistance       General transfer comment: increased time and effort, multimodal cueing for technique and safe hand placement, heavy assist to power into standing and for pivot from bed to [facility] and then [facility] to recliner chair  Ambulation/Gait                Stairs            Wheelchair Mobility    Modified Rankin (Stroke Patients Only) Modified Rankin (Stroke Patients Only) Pre-Morbid Rankin Score: Moderate disability Modified Rankin: Severe disability     Balance Overall balance assessment: Needs assistance Sitting-balance support: Feet supported Sitting balance-Leahy Scale: Fair     Standing balance support: During functional activity;Bilateral upper extremity supported Standing balance-Leahy Scale: Poor Standing balance comment: max A x2                             Pertinent Vitals/Pain Pain Assessment: No/denies pain    Home Living Family/patient expects to be discharged to:: Unsure Living Arrangements: Alone Available Help at Discharge: Family;Available 24 hours/day Type of Home: House Home Access: Stairs to enter   [employer] of Steps: 2 Home Layout: One level Home Equipment: Shower seat;Grab bars - tub/shower;Cane - single point      Prior Function Level of Independence: Needs assistance   Gait / Transfers Assistance Needed: minimal  ambulation with assist.   ADL's / Homemaking Assistance Needed: assist with ADLs.   Comments: d/ced last Saturday     Hand Dominance   Dominant Hand: Right    Extremity/Trunk Assessment   Upper Extremity Assessment Upper Extremity Assessment: Defer to OT evaluation     Lower Extremity Assessment Lower Extremity Assessment: LLE deficits/detail LLE Deficits / Details: pt with decreased sensation and decreased strength; unable to formally test strength secondary to cognitive deficits LLE Coordination: decreased gross motor;decreased fine motor    Cervical / Trunk Assessment Cervical / Trunk Assessment: Kyphotic  Communication   Communication: HOH  Cognition Arousal/Alertness: Awake/alert Behavior During Therapy: Flat affect Overall Cognitive Status: Impaired/Different from baseline Area of Impairment: Attention;Memory;Following commands;Safety/judgement;Problem solving                   Current Attention Level: Focused Memory: Decreased recall of precautions;Decreased short-term memory Following Commands: Follows one step commands with increased time;Follows one step commands inconsistently Safety/Judgement: Decreased awareness of deficits;Decreased awareness of safety   Problem Solving: Slow processing;Decreased initiation;Difficulty sequencing;Requires verbal cues;Requires tactile cues        General Comments      Exercises     Assessment/Plan    PT Assessment Patient needs continued PT services  PT Problem List Decreased strength;Decreased range of motion;Decreased activity tolerance;Decreased balance;Decreased mobility;Decreased coordination;Decreased knowledge of use of DME;Decreased cognition;Decreased safety awareness;Decreased knowledge of precautions       PT Treatment Interventions DME instruction;Gait training;Stair training;Functional mobility training;Therapeutic activities;Therapeutic exercise;Balance training;Neuromuscular re-education;Cognitive remediation;Patient/family education    PT Goals (Current goals can be found in the Care Plan section)  Acute Rehab PT Goals Patient Stated Goal: to return to independence (per sister) PT Goal Formulation: With patient/family Time For Goal Achievement: 05/07/18 Potential  to Achieve Goals: Good    Frequency Min 4X/week   Barriers to discharge        Co-evaluation PT/OT/SLP Co-Evaluation/Treatment: Yes Reason for Co-Treatment: Complexity of the patient's impairments (multi-system involvement);Necessary to address cognition/behavior during functional activity;For patient/therapist safety;To address functional/ADL transfers PT goals addressed during session: Mobility/safety with mobility;Balance;Proper use of DME;Strengthening/ROM         AM-PAC PT "6 Clicks" Daily Activity  Outcome Measure Difficulty turning over in bed (including adjusting bedclothes, sheets and blankets)?: Unable Difficulty moving from lying on back to sitting on the side of the bed? : Unable Difficulty sitting down on and standing up from a chair with arms (e.g., wheelchair, bedside commode, etc,.)?: Unable Help needed moving to and from a bed to chair (including a wheelchair)?: A Lot Help needed walking in hospital room?: Total Help needed climbing 3-5 steps with a railing? : Total 6 Click Score: 7    End of Session Equipment Utilized During Treatment: Gait belt Activity Tolerance: Patient tolerated treatment well Patient left: in chair;with call bell/phone within reach;with family/visitor present Nurse Communication: Mobility status;Other (comment)(pt pocketing food and choking on water) PT Visit Diagnosis: Other abnormalities of gait and mobility (R26.89);Other symptoms and signs involving the nervous system (R29.898)    Time: [id number] PT Time Calculation (min) (ACUTE ONLY): 38 min   Charges:   PT Evaluation $PT Eval Moderate Complexity: 1 Mod PT Treatments $Therapeutic Activity: 8-22 mins        Deborah ChalkJennifer Rashad Auld, PT, DPT  Acute Rehabilitation Services Pager [phone] Office [phone]    Alessandra BevelsJennifer M Kenan Moodie 04/23/2018, 5:34 PM

## 2018-04-23 NOTE — Progress Notes (Signed)
STROKE TEAM PROGRESS NOTE   SUBJECTIVE (INTERVAL HISTORY) Her daughter and sister are at the bedside.  Patient mildly lethargic, however able to follow all commands, easily arousable.  Still has mild left-sided weakness, much improved from yesterday.  MRI showed right MCA stroke extended from previous stroke, likely due to hypotension/hypoperfusion.  Her sister did state that lately patient blood pressure tends to be low.  As per daughter, patient was upstairs yesterday and seemed to be getting up from bed and then fell at bedside.  Not sure blood pressure at that time, but patient stated that he felt dizzy before the fall, assuming she likely had orthostatic hypotension.   OBJECTIVE Vitals:   04/22/18 2319 04/23/18 0500 04/23/18 0755 04/23/18 1155  BP: (!) 151/67 (!) 148/80 140/64 (!) 147/62  Pulse:  78 86 75  Resp:  (!) 21 17 (!) 22  Temp: (!) 97.5 F (36.4 C)  98.7 F (37.1 C) 98.6 F (37 C)  TempSrc: Oral  Axillary Oral  SpO2:  95% 95% 95%    CBC:  Recent Labs  Lab 04/17/18 2348  04/18/18 0522 04/22/18 1005 04/22/18 1014  WBC 10.0  --  9.6 10.2  --   NEUTROABS 5.4  --   --  5.3  --   HGB 12.5   < > 12.6 13.8 14.3  HCT 39.1   < > 38.5 42.9 42.0  MCV 92.0  --  92.5 92.1  --   PLT 207  --  199 199  --    < > = values in this interval not displayed.    Basic Metabolic Panel:  Recent Labs  Lab 04/17/18 2348  04/22/18 1005 04/22/18 1014  NA 140   < > 139 140  K 4.3   < > 4.1 3.8  CL 107   < > 106 106  CO2 24  --  22  --   GLUCOSE 109*   < > 142* 142*  BUN 13   < > 14 16  CREATININE 0.78   < > 1.02* 0.90  CALCIUM 9.1  --  9.0  --    < > = values in this interval not displayed.    Lipid Panel:     Component Value Date/Time   CHOL 244 (H) 04/18/2018 0522   TRIG 161 (H) 04/18/2018 0522   HDL 48 04/18/2018 0522   CHOLHDL 5.1 04/18/2018 0522   VLDL 32 04/18/2018 0522   LDLCALC 164 (H) 04/18/2018 0522   HgbA1c:  Lab Results  Component Value Date   HGBA1C 5.8  (H) 04/18/2018   Urine Drug Screen: No results found for: LABOPIA, COCAINSCRNUR, LABBENZ, AMPHETMU, THCU, LABBARB  Alcohol Level No results found for: ETH  IMAGING  Ct Angio Head W Or Wo Contrast Ct Angio Neck W Or Wo Contrast Ct Cerebral Perfusion W Contrast 04/22/2018 IMPRESSION:  1. Constellation of plain CT, CT Perfusion, and recent MRI findings consistent with a Subacute Right MCA infarct affecting the posterior right temporal lobe and periatrial white matter.  2. Right MCA M1 severe stenosis versus recent thrombo-embolic near occlusion without improvement since the 04/18/2018 CTA, but no interval branch occlusion.  3. Subsequent prolonged perfusion time throughout the Right MCA territory, with the worst Tmax area corresponding to the subacute infarct.  4. Stable arterial findings on CTA head and neck elsewhere, no new stenosis, but other notable atherosclerotic stenosis: - supraclinoid Right ICA siphon, moderate. - Left CCA origin up to 60%. - Left ICA bulb up  to 65%.    Mr Brain Wo Contrast 04/22/2018 IMPRESSION:  1. Interval worsening in multiple right MCA territory infarcts, with multiple new/larger infarcts involving the right MCA distribution as above. Infarcts primarily watershed in nature. No associated hemorrhage.  2. Otherwise stable appearance of the brain. No other acute intracranial abnormality identified.    Ct Head Code Stroke Wo Contrast 04/22/2018 IMPRESSION:  1. New/larger infarcts in the posterior right temporal lobe and posterior right insula. No hemorrhage.  2. ASPECTS is 8.  3. Evolving early subacute infarcts in the right basal ganglia and white matter as shown on recent prior MRI.    Transthoracic Echocardiogram  04/18/2018 Study Conclusions  - Left ventricle: The cavity size was normal. Wall thickness was   normal. Systolic function was normal. The estimated ejection   fraction was in the range of 60% to 65%. The study is not   technically sufficient  to allow evaluation of LV diastolic   function. - Aortic valve: Sclerosis without stenosis. - Mitral valve: Severely calcified annulus. Moderately thickened,   moderately calcified leaflets . The findings are consistent with   mild stenosis. There was mild regurgitation. Valve area by   continuity equation (using LVOT flow): 1 cm^2. - Left atrium: The atrium was moderately to severely dilated. - Atrial septum: No defect or patent foramen ovale was identified. - Pulmonary arteries: PA peak pressure: 43 mm Hg (S).    PHYSICAL EXAM  Temp:  [97.5 F (36.4 C)-98.7 F (37.1 C)] 98.6 F (37 C) (08/30 1155) Pulse Rate:  [71-175] 75 (08/30 1155) Resp:  [14-22] 22 (08/30 1155) BP: (124-151)/(62-100) 147/62 (08/30 1155) SpO2:  [95 %-99 %] 95 % (08/30 1155)  General - Well nourished, well developed, lethargic.  Ophthalmologic - fundi not visualized due to noncooperation.  Cardiovascular - Regular rate and rhythm with no murmur, not in afib.  Mental Status -  Level of arousal and orientation to place, and person were intact, but not orientated to time and age. Language including expression, naming, repetition, comprehension was assessed and found intact.  Cranial Nerves II - XII - II - Visual field intact OU, however left simultanagnosia. III, IV, VI - Extraocular movements intact. V - Facial sensation intact bilaterally. VII -mild left facial droop. VIII - Hearing & vestibular intact bilaterally. X - Palate elevates symmetrically. XI - Chin turning & shoulder shrug intact bilaterally. XII - Tongue protrusion intact.  Motor Strength - The patient's strength was normal in right upper and lower extremities, however, left upper extremity 4+/5, and left lower extremity 5/5 proximal and 4+/5 distally and pronator drift was absent.  Bulk was normal and fasciculations were absent.   Motor Tone - Muscle tone was assessed at the neck and appendages and was normal.  Reflexes - The patient's  reflexes were symmetrical in all extremities and she had no pathological reflexes.  Sensory - Light touch, temperature/pinprick were assessed and were symmetrical.    Coordination - The patient had normal movements in the hands with no ataxia or dysmetria.  Tremor was absent.  Gait and Station - deferred.    ASSESSMENT/PLAN [name] is a 84 y.o. female with history of  HTN, HLD, previous stroke, and atrial fib (new diagnosis) presenting with AMS. She did not receive IV t-PA due to late presentation.   Stroke:  R MCA infarcts likely due to severe right MCA stenosis in the setting of orthostatic hypotension  Resultant mild left hemiparesis  CT head - New/larger infarcts  in the posterior right temporal lobe and posterior right insula.  MRI head - Interval worsening in multiple right MCA territory infarcts.  CTA H&N - Right MCA M1 severe stenosis versus near occlusion   VTE prophylaxis - Lovenox 40 mg daily  Diet - Heart healthy / carb modified with thin liquids.  aspirin 325 mg daily and clopidogrel 75 mg daily prior to admission, now on aspirin 325 mg daily and clopidogrel 75 mg daily.  Given A. fib and right MCA stroke extension, recommend to initiate Eliquis 5 mg twice daily at 5 days post stroke on 04/27/18.  Patient counseled to be compliant with her antithrombotic medications  Ongoing aggressive stroke risk factor management  Therapy recommendations:  pending  Disposition:  Pending  Recent stroke 04/17/18  Admitted for speech and gait difficulties  MRI right MCA territory scattered small/punctate infarct  CT head and neck right M1 6 mm lens segment of near occlusion, severe thread-like stenosis  LE venous Doppler no DVT  2D Echo - 04/18/2018 - EF 60 - 65% No defect or patent foramen ovale was identified.  LDL 164  A1c 5.8  Put on aspirin 325 and Plavix 75 DAPT for 3 months  Discharge home with home PT/OT  PAF  EKG confirmed PAF on this  admission  Will cancel 30-day CardioNet monitoring ordered on last admission  Currently NSR  Recommend anticoagulation with Eliquis 5 mill grams twice daily at 5 days post stroke on 04/27/2018  Orthostatic hypotension  Patient sister admitted patient had low blood pressure recently at home  Before the fall at this time, patient failed dizziness/lightheadedness  Likely the cause of stroke extension this time  TED hose placement  Orthostatic vital with PT/OT - pending  Right MCA high-grade stenosis  Likely because of the stroke in the setting of hypotension  Discussed with sister and daughter at bedside regarding pros and cons of stent placement  Patient daughter seems not interested for stent placement at this time, but open to discuss in the future.  Hyperlipidemia  Lipid lowering medication PTA: Zetia 10 mg daily  LDL 164, goal < 70  Current lipid lowering medication: Zetia 10 mg daily - consider change to Lipitor.  Patient has statin intolerance  Recommend to consider PCSK9 inhibitors as outpatient  Other Stroke Risk Factors  Advanced age  Former cigarette smoker - quit  Other Active Problems  4 mm right upper lobe pulmonary nodule on CTA. Continue outpt follow up.   Statin intolerance  Hospital day # 1  I spent  35 minutes in total face-to-face time with the patient, more than 50% of which was spent in counseling and coordination of care, reviewing test results, images and medication, and discussing the diagnosis of Recurrent stroke, right MCA high-grade stenosis, paroxysmal A. fib, and orthostatic hypotension, treatment plan and potential prognosis. This patient's care requiresreview of multiple databases, neurological assessment, discussion with family, other specialists and medical decision making of high complexity. I had long discussion with daughter and sister at bedside, updated pt current condition, treatment plan and potential prognosis. They expressed  understanding and appreciation.    Marvel Plan, MD PhD Stroke Neurology 04/23/2018 2:51 PM    To contact Stroke Continuity provider, please refer to [email or web address]. After hours, contact General Neurology

## 2018-04-23 NOTE — H&P (Signed)
Internal Medicine Attending Admission Note Date: 04/23/2018  Patient name: Katelyn Mckinney Medical record number: [id number] Date of birth: [birth year] Age: 84 y.o. Gender: female  I saw and evaluated the patient. I reviewed the resident's note and I agree with the resident's findings and plan as documented in the resident's note.  Chief Complaint(s): Fall with facial droop  History - key components related to admission:  Katelyn Mckinney is an 84 year old woman with a history of cerebrovascular accident of the right MCA territory last week, hypertension, and hyperlipidemia who presents after a fall with associated facial droop. During the prior admission she was found to have what were felt to be watershed infarcts in the right MCA territory with a significant right M1 stenosis. At the time, it was felt to be secondary to relative hypotension with decreased perfusion, but a cardioembolic source could not be excluded. She was placed on aspirin 325 mg by mouth daily and Plavix 75 mg by mouth daily. She was referred to cardiology for a 30 day event monitor which she was to pick up in 6 days. Since discharge, she was relatively weak with minimal activity while at home being cared for by her daughter. On the morning of admission, her daughter heard a thump and found her sitting next to the bed. Her daughter felt her left facial droop had worsened compared to the baseline after the previous stroke. She was therefore brought to the emergency department for reevaluation. CT scan, CT angiography, and MRI of the head revealed progressive evolution of her recent infarcts. In addition, she was found to be in atrial fibrillation with rapid ventricular rate. She was therefore readmitted to the internal medicine teaching service for further evaluation and care.  When seen on rounds the morning after admission she was slightly stronger and more alert. She had a headache, but her daughter felt her facial droop had lessened.  In addition, her mental status had improved and she was alert and oriented 2 which was her baseline from the previous admission.  Physical Exam - key components related to admission:  Vitals:   04/22/18 2149 04/22/18 2319 04/23/18 0500 04/23/18 0755  BP: (!) 149/64 (!) 151/67 (!) 148/80 140/64  Pulse: 71  78 86  Resp: 20  (!) 21 17  Temp: 98.4 F (36.9 C) (!) 97.5 F (36.4 C)  98.7 F (37.1 C)  TempSrc: Oral Oral  Axillary  SpO2:   95% 95%   Gen.: Well-developed, well-nourished, woman lying flat in bed in no acute distress. She is somnolent but arousable and answers questions and follows commands appropriately. Lungs: Clear to auscultation bilaterally anteriorly Heart: Regular rate and rhythm without murmurs, rubs, or gallops. Abdomen: Soft, nontender, without guarding or rebound. Extremities: Without edema. Neuro: Alert and oriented to name and place. She was not oriented to year. Her left upper extremity was 3/5 and her left lower, right upper, and right lower extremities were 4/5. He did not check cerebellar function and gait  Lab results:  Basic Metabolic Panel: Recent Labs    04/22/18 1005 04/22/18 1014  NA 139 140  K 4.1 3.8  CL 106 106  CO2 22  --   GLUCOSE 142* 142*  BUN 14 16  CREATININE 1.02* 0.90  CALCIUM 9.0  --    Liver Function Tests: Recent Labs    04/22/18 1005  AST 33  ALT 23  ALKPHOS 49  BILITOT 1.0  PROT 6.3*  ALBUMIN 3.4*   CBC: Recent Labs  04/22/18 1005 04/22/18 1014  WBC 10.2  --   NEUTROABS 5.3  --   HGB 13.8 14.3  HCT 42.9 42.0  MCV 92.1  --   PLT 199  --    Coagulation: Recent Labs    04/22/18 1005  INR 1.12   Misc. Labs:  TSH Pending  Imaging results:   CT of the head without contrast: Personally reviewed. Evolving subacute infarct in the right temporal lobe, no acute bleed, mild atrophy.  CT angiogram of the head: Please see the radiologist's interpretation in the imaging section.  MRI of the head: Personally  reviewed.  Progression of the right MCA distribution infarcts, chronic bilateral white matter changes in the cortex.  Other results:  EKG: Personally reviewed. Atrial fibrillation at 133 bpm, normal axis, normal intervals, significant Q wave in III, no LVH by voltage, early R wave progression, 1 mm ST depression in the lateral limb leads, T-wave inversion in III and flattening of the T waves in V5 and V6. The atrial fibrillation, ST segment and T-wave changes are new compared to the ECG on 04/18/2018 and had resolved by the ECG from this morning.  Assessment & Plan by Problem:  Katelyn Mckinney is an 84 year old woman with a history of cerebrovascular accident of the right MCA territory last week, hypertension, and hyperlipidemia who presents after a fall with associated facial droop. During the prior admission she was found to have what were felt to be watershed infarcts in the right MCA territory with a significant right M1 stenosis.  During this presentation she was felt to have extensions of her previous infarcts in the MCA territory, again consistent with a watershed mechanism. Interestingly, she was also found to be in atrial fibrillation with rapid ventricular rate. Imaging was not supportive of the concept that her current presentation was related to a thromboembolic phenomenon from the atrial fibrillation. I believe the mechanism of her presentation is similar to the previous one. In particular, I suspect she went into atrial fibrillation with rapid ventricular rate and had a decrease in her blood pressure. This led to decreased perfusion in the area of the brain perfused by the M1 branch of the middle cerebral artery. She developed weakness, fell, and had a right facial droop. That said, the atrial fibrillation will eventually require anticoagulation. The recent echo was notable for mild mitral stenosis secondary to a calcified annulus and leaf thickening with a mean gradient of 4 mmHg. This would be  considered nonvalvular atrial fibrillation and she would be a candidate for a DOAC or NOAC.  1) Extension of a recent watershed CVA secondary to an M1 branch middle cerebral artery stenosis: We will continue with the permissive hypertension, aspirin, and Plavix for now. On hospital day 3 (9/1) we will add oral metoprolol 25 mg PO BID, to act as a break were she to flip back into atrial fibrillation. At the same time, we will convert the Plavix and aspirin to a NOAC given her atrial fibrillation that is considered nonvalvular.  She will be evaluated by speech therapy, physical therapy, and occupational therapy when stable to assess the need for a skilled nursing facility versus continued home health.  2) Paroxysmal atrial fibrillation that presented in rapid ventricular rate: She has flipped back into normal sinus rhythm. That said, she will require a nodal agent in order to prevent a rapid ventricular rate should she flip back into atrial fibrillation. Because of concerns about the recent CVA we will allow for permissive hypertension  until Sunday, September 1. At that point, we will add metoprolol 25 mg by mouth twice daily to her regimen in hopes of providing a potential break if she were to flip back into atrial fibrillation. Also on that day, we will initiate a Noac as anticoagulation given the paroxysmal atrial fibrillation.  Now that she has been diagnosed with paroxysmal atrial fibrillation, she no longer requires a 30 day event monitor and the daughter was asked to call and cancel the appointment next week with cardiology.  3) Disposition: Pending the assessment by physical therapy and thus ultimately the decision on her post hospital level of care needs.

## 2018-04-23 NOTE — Progress Notes (Signed)
Internal Medicine Attending  Date: 04/23/2018  Patient name: Katelyn LeydenDoris T Mckinney Medical record number: [id number] Date of birth: 09/18/1925 Age: 84 y.o. Gender: female  I saw and evaluated the patient. I reviewed the resident's note by Dr. Karilyn Cotaehman and I agree with the resident's findings and plans as documented in her progress note.  Please see my H&P dated 04/23/2018 for the specifics of my evaluation, assessment, and plan.

## 2018-04-23 NOTE — Evaluation (Signed)
Occupational Therapy Evaluation Patient Details Name: Katelyn Mckinney MRN: [id number] DOB: 08-07-1926 Today's Date: 04/23/2018    History of Present Illness Pt is an 84 y/o female with a history of cerebrovascular accident of the right MCA territory last week, hypertension, and hyperlipidemia who presents after a fall with associated facial droop. Pt found to have an extension of a recent watershed CVA secondary to an M1 branch middle cerebral artery stenosis.   Clinical Impression   Pt recently discharge from [facility], and has been doing short ambulation using furniture for support (has a SPC but does not use); and req. Assist for bathing and dressing. Pt is currently demonstrating significant left inattention, decreased sensation, strength and ROM in LUE (unable to hold onto RW during transfers) and requiring mod to max A for all ADL. Pt is max A +2 for stand pivot transfer from bed>BSC>recliner. Pt will require continued skilled OT in the acute setting as well as SNF level therapy at discharge to maximize safety and independence in ADL and functional transfers.    Attempted to take orthostatic vitals per MD request; however, was only able to obtain supine and sitting BP: Supine = 131/74 Sitting initially = 119/68 Sitting at end of session after transfers = 111/91   Follow Up Recommendations  SNF;Supervision/Assistance - 24 hour    Equipment Recommendations  Other (comment)(defer to next venue)    Recommendations for Other Services Speech consult(swallowing)     Precautions / Restrictions Precautions Precautions: Fall Precaution Comments: L inattention      Mobility Bed Mobility Overal bed mobility: Needs Assistance Bed Mobility: Rolling;Sidelying to Sit Rolling: Max assist;+2 for physical assistance Sidelying to sit: Max assist;+2 for physical assistance       General bed mobility comments: increased time and effort, multimodal cueing for sequencing and technique, heavy  physical assist of two for all aspects  Transfers Overall transfer level: Needs assistance Equipment used: Rolling walker (2 wheeled) Transfers: Sit to/from [employer] Sit to Stand: Max assist;+2 physical assistance Stand pivot transfers: Max assist;+2 physical assistance       General transfer comment: increased time and effort, multimodal cueing for technique and safe hand placement, heavy assist to power into standing and for pivot from bed to [facility] and then BSC to recliner chair    Balance Overall balance assessment: Needs assistance Sitting-balance support: Feet supported Sitting balance-Leahy Scale: Fair     Standing balance support: During functional activity;Bilateral upper extremity supported Standing balance-Leahy Scale: Poor Standing balance comment: max A x2                           ADL either performed or assessed with clinical judgement   ADL Overall ADL's : Needs assistance/impaired Eating/Feeding: Maximal assistance;Sitting Eating/Feeding Details (indicate cue type and reason): pre-loaded spoon in RUE, pt only getting halfway into mouth Grooming: Moderate assistance;Sitting   Upper Body Bathing: Maximal assistance   Lower Body Bathing: Total assistance;Bed level   Upper Body Dressing : Maximal assistance   Lower Body Dressing: Total assistance;Bed level;+2 for physical assistance   Toilet Transfer: Maximal assistance;+2 for physical assistance;+2 for safety/equipment;Stand-pivot;BSC   Toileting- Clothing Manipulation and Hygiene: Maximal assistance;+2 for safety/equipment;+2 for physical assistance;Sit to/from stand       Functional mobility during ADLs: Maximal assistance;+2 for physical assistance;+2 for safety/equipment General ADL Comments: inattention on L side, impacting ADL, decreased strength and ROM on left side     Vision   Vision  Assessment?: Yes Alignment/Gaze Preference: Gaze left;Head turned Tracking/Visual  Pursuits: Decreased smoothness of horizontal tracking;Unable to hold eye position out of midline;Requires cues, head turns, or add eye shifts to track;Impaired - to be further tested in functional context Additional Comments: cognition impacting visual testing     Perception     Praxis      Pertinent Vitals/Pain Pain Assessment: No/denies pain     Hand Dominance Right   Extremity/Trunk Assessment Upper Extremity Assessment Upper Extremity Assessment: LUE deficits/detail;Generalized weakness LUE Deficits / Details: pt with decreased sensation and decreased strength; unable to formally test strength secondary to cognitive deficits LUE Sensation: decreased light touch LUE Coordination: decreased fine motor;decreased gross motor   Lower Extremity Assessment Lower Extremity Assessment: Defer to PT evaluation LLE Deficits / Details: pt with decreased sensation and decreased strength; unable to formally test strength secondary to cognitive deficits LLE Coordination: decreased gross motor;decreased fine motor   Cervical / Trunk Assessment Cervical / Trunk Assessment: Kyphotic   Communication Communication Communication: HOH   Cognition Arousal/Alertness: Awake/alert Behavior During Therapy: Flat affect Overall Cognitive Status: Impaired/Different from baseline Area of Impairment: Attention;Memory;Following commands;Safety/judgement;Problem solving                   Current Attention Level: Focused Memory: Decreased recall of precautions;Decreased short-term memory Following Commands: Follows one step commands with increased time;Follows one step commands inconsistently Safety/Judgement: Decreased awareness of deficits;Decreased awareness of safety   Problem Solving: Slow processing;Decreased initiation;Difficulty sequencing;Requires verbal cues;Requires tactile cues     General Comments  sister in room throughout session, Pt coughing on thin liquids and pocketing pinto  beans    Exercises     Shoulder Instructions      Home Living Family/patient expects to be discharged to:: Unsure Living Arrangements: Alone Available Help at Discharge: Family;Available 24 hours/day Type of Home: House Home Access: Stairs to enter [employer] CorporationEntrance Stairs-Number of Steps: 2   Home Layout: One level     Bathroom Shower/Tub: Chief Strategy OfficerTub/shower unit   Bathroom Toilet: Standard     Home Equipment: Shower seat;Grab bars - tub/shower;Cane - single point          Prior Functioning/Environment Level of Independence: Needs assistance  Gait / Transfers Assistance Needed: minimal ambulation with assist.  ADL's / Homemaking Assistance Needed: assist with ADLs.    Comments: d/ced last Saturday        OT Problem List: Decreased strength;Decreased range of motion;Decreased activity tolerance;Impaired balance (sitting and/or standing);Impaired vision/perception;Decreased coordination;Decreased cognition;Decreased safety awareness;Impaired sensation;Impaired UE functional use      OT Treatment/Interventions: Self-care/ADL training;Therapeutic exercise;Energy conservation;DME and/or AE instruction;Therapeutic activities;Patient/family education;Balance training;Visual/perceptual remediation/compensation;Cognitive remediation/compensation    OT Goals(Current goals can be found in the care plan section) Acute Rehab OT Goals Patient Stated Goal: to return to independence (per sister) OT Goal Formulation: With patient/family Time For Goal Achievement: 05/07/18 Potential to Achieve Goals: Good ADL Goals Pt Will Perform Grooming: with supervision;sitting Pt Will Transfer to Toilet: with min assist;stand pivot transfer;bedside commode Pt Will Perform Toileting - Clothing Manipulation and hygiene: sit to/from stand;with min assist Pt/caregiver will Perform Home Exercise Program: Left upper extremity;With written HEP provided;With Supervision Additional ADL Goal #1: Pt will find at least 2  items on the left side during ADL (specifically eating or grooming) with min verbal cues  OT Frequency: Min 2X/week   Barriers to D/C:    Pt has excellent support, but lives alone typically       Co-evaluation PT/OT/SLP Co-Evaluation/Treatment: Yes Reason for Co-Treatment:  Complexity of the patient's impairments (multi-system involvement);Necessary to address cognition/behavior during functional activity;For patient/therapist safety;To address functional/ADL transfers PT goals addressed during session: Mobility/safety with mobility;Balance;Proper use of DME;Strengthening/ROM OT goals addressed during session: ADL's and self-care;Proper use of Adaptive equipment and DME;Strengthening/ROM      AM-PAC PT "6 Clicks" Daily Activity     Outcome Measure Help from another person eating meals?: A Lot Help from another person taking care of personal grooming?: A Lot Help from another person toileting, which includes using toliet, bedpan, or urinal?: A Lot Help from another person bathing (including washing, rinsing, drying)?: A Lot Help from another person to put on and taking off regular upper body clothing?: A Lot Help from another person to put on and taking off regular lower body clothing?: A Lot 6 Click Score: 12   End of Session Equipment Utilized During Treatment: Gait belt;Rolling walker Nurse Communication: Mobility status;Other (comment)(concerns for aspiration, left inattention)  Activity Tolerance: Patient tolerated treatment well Patient left: in chair;with call bell/phone within reach;with family/visitor present;with chair alarm set  OT Visit Diagnosis: Other abnormalities of gait and mobility (R26.89);Repeated falls (R29.6);Muscle weakness (generalized) (M62.81);Feeding difficulties (R63.3);Other symptoms and signs involving the nervous system (R29.898);Other symptoms and signs involving cognitive function;Hemiplegia and hemiparesis Hemiplegia - Right/Left: Left Hemiplegia -  dominant/non-dominant: Non-Dominant Hemiplegia - caused by: Cerebral infarction                Time: 1610-9604 OT Time Calculation (min): 38 min Charges:  OT General Charges $OT Visit: 1 Visit OT Evaluation $OT Eval Moderate Complexity: 1 Mod  Sherryl Manges OTR/L Acute Rehabilitation Services Pager: [phone] Office: [phone]  Evern Bio Gwynn Chalker 04/23/2018, 6:22 PM

## 2018-04-23 NOTE — Progress Notes (Signed)
   Subjective: [name] reported feeling lousy today. She was somnolent during interview. She denied any symptoms overnight of lightheadedness, dizziness, changes in her vision, SOB, chest pain, chest pounding or tightness. Her daughter was at the bedside and said she noticed her facial droop has improved since admission and did not think the patient knew if she had any of symptoms or not. All questions and concerns were addressed.    Objective:  Vital signs in last 24 hours: Vitals:   04/22/18 2149 04/22/18 2319 04/23/18 0500 04/23/18 0755  BP: (!) 149/64 (!) 151/67 (!) 148/80 140/64  Pulse: 71  78 86  Resp: 20  (!) 21 17  Temp: 98.4 F (36.9 C) (!) 97.5 F (36.4 C)  98.7 F (37.1 C)  TempSrc: Oral Oral  Axillary  SpO2:   95% 95%   Physical Exam  Constitutional: She is well-developed, well-nourished, and in no distress.  Cardiovascular: Normal rate, regular rhythm and normal heart sounds.  No murmur heard. Pulmonary/Chest: Effort normal and breath sounds normal. No respiratory distress.  Neurological: alert and oriented to person and place, continues to be disoriented to time, bilateral UE and LE sensation intact, slight left sided facial droop, bilateral UE strength 4/5, left LE strength 3/5, right LE strength 4/5   Assessment/Plan:  Active Problems:   Stroke (HCC)   Paroxysmal atrial fibrillation (HCC)  [name] is a 84 y.o female with a PMHx of CVAs, HTN, and HLD presenting with slurred speech, left sided facial droop and right sided weakness. [facility] territory CVA on dual antiplatelet therapy and ezetimibe.  Acute CVA Patient still has mild left sided facial droop. Alert and oriented to person and place; remains disoriented to time. MRI head findings illustrated interval worsening in multiple right MCA territory infarcts, with multiple new/larger infarcts involving the right MCA distribution, no associated hemorrhage.  Expansion of previous right MCA territory stroke in the setting of partial occlusion vs stenosis of right M1 segment of MCA, per neuro.  -- maintain normotensive blood pressures, per neuro -- maintain bed flat -25 degrees, per neuro -- continue aspirin 325 mg, clopidogrel 75 mg and ezetimibe 10 mg QD -- PT/OT and speech pending -- f/u TSH  -- every 2 hour neurochecks  Paroxysmal Atrial Fibrillation Patient received two doses of metoprolol 5 mg IV x2 yesterday afternoon. She converted back to sinus rhythm at ~630 pm yesterday. Repeat EKG illustrated sinus rhythm. Further evaluation of echo findings from last admission showed mild calcified mitral stenosis with a mean gradient of 4 mm Hg and peak gradient 13 mm Hg, suggestive of non-valvular atrial fibrillation. Will plan to start oral anticoagulation therapy on 9/1 to prevent risk of hemorrhagic conversion. If patient converts back into atrial fibrillation will consider rate control with metoprolol, with caution to prevent decreasing BP. Will plan to start continuous rate control therapy with metoprolol on 9/1. -- metoprolol 5mg  IV if pt converts back into afib -- continue telemetry  -- start oral anticoagulation therapy and rate control therapy on 9/1  Hyperlipidemia -- continue ezetimibe 10 mg QD  HTN -- maintain normotensive blood pressures, per neuro -- continuing to hold home medication atenolol 100 mg QD  Diet: Heart healthy Code: DNR VTE Prophylaxis: lovenox   Dispo: Anticipated discharge pending on patient's clinical improvement.   Jaci StandardRehman, Areeg N, DO 04/23/2018, 10:48 AM Pager: [phone]

## 2018-04-23 NOTE — Evaluation (Signed)
Speech Language Pathology Evaluation Patient Details Name: Katelyn Mckinney MRN: [id number] DOB: [birth year] Today's Date: 04/23/2018 Time: 0945-1000 SLP Time Calculation (min) (ACUTE ONLY): 15 min  Problem List:  Patient Active Problem List   Diagnosis Date Noted  . Paroxysmal atrial fibrillation (HCC) 04/23/2018  . Stroke (HCC) 04/22/2018  . Acute ischemic right MCA stroke (HCC)   . Essential hypertension   . Pure hypercholesterolemia   . Solitary pulmonary nodule   . CVA (cerebral vascular accident) (HCC) 04/18/2018   Past Medical History:  Past Medical History:  Diagnosis Date  . Atrial fibrillation (HCC) 04/22/2018  . CVA (cerebral vascular accident) (HCC)   . Diverticulitis   . GERD (gastroesophageal reflux disease)   . Hyperlipidemia   . Hypertension    Past Surgical History:  Past Surgical History:  Procedure Laterality Date  . CARPAL TUNNEL RELEASE    . CATARACT EXTRACTION, BILATERAL    . HEMORROIDECTOMY    . KNEE ARTHROPLASTY     HPI:  84 year old female with history of hyperlipidemia, recent right MCA infarct in the setting of right MCA m1 segment stenosis vs partial occlusion presents to the emergency room after having a fall.  CT showed extension of previous right MCA territory CVA.    Assessment / Plan / Recommendation Clinical Impression  Pt presents with exacerbation of a range of cognitive deficits s/p extension of previous right CVA - this is marked by inattention, short term/working memory deficits, disorientation to elements of time/situation/place, impaired awareness.  Pt's speech is clear; her expression is fluent and comprehension is functional.  Her daughter, Katelyn Mckinney, was present for assessment and would like for her mother improve to the degree that she can take her back home.  SLP f/u recommended to ensure functional cognition necessary even with limited environmental demands.    SLP Assessment  SLP Recommendation/Assessment: Patient needs  continued Speech Lanaguage Pathology Services SLP Visit Diagnosis: Cognitive communication deficit (R41.841)    Follow Up Recommendations  Other (comment)(tba)    Frequency and Duration min 1 x/week  1 week      SLP Evaluation Cognition  Overall Cognitive Status: Impaired/Different from baseline Arousal/Alertness: Awake/alert Orientation Level: Oriented to person;Disoriented to place;Disoriented to time;Disoriented to situation Attention: Sustained Sustained Attention: Impaired Sustained Attention Impairment: Verbal basic Memory: Impaired Memory Impairment: Storage deficit;Retrieval deficit Awareness: Impaired Awareness Impairment: Intellectual impairment Problem Solving: Impaired Problem Solving Impairment: Verbal basic       Comprehension  Auditory Comprehension Overall Auditory Comprehension: Appears within functional limits for tasks assessed Reading Comprehension Reading Status: Not tested    Expression Expression Primary Mode of Expression: Verbal Verbal Expression Overall Verbal Expression: Appears within functional limits for tasks assessed Written Expression Dominant Hand: Right   Oral / Motor  Motor Speech Overall Motor Speech: Appears within functional limits for tasks assessed   GO                    Katelyn Mckinney 04/23/2018, 11:05 AM  Katelyn Mckinney Pager [phone]

## 2018-04-23 NOTE — Progress Notes (Signed)
PT Cancellation Note  Patient Details Name: Katelyn Mckinney MRN: [id number] DOB: June 06, 1926   Cancelled Treatment:    Reason Eval/Treat Not Completed: Patient not medically ready Per neurology, pt to keep HOB at 25 degrees. Will follow up as appropriate/time allows.   Blake DivineShauna A Codee Bloodworth 04/23/2018, 8:46 AM  Mylo RedShauna Ila Landowski, PT, DPT Acute Rehabilitation Services Pager [phone] Office [phone]

## 2018-04-24 DIAGNOSIS — I63311 Cerebral infarction due to thrombosis of right middle cerebral artery: Secondary | ICD-10-CM

## 2018-04-24 DIAGNOSIS — I639 Cerebral infarction, unspecified: Secondary | ICD-10-CM

## 2018-04-24 DIAGNOSIS — R1310 Dysphagia, unspecified: Secondary | ICD-10-CM

## 2018-04-24 LAB — T4, FREE: Free T4: 0.99 ng/dL (ref 0.82–1.77)

## 2018-04-24 LAB — TSH: TSH: 6.084 u[IU]/mL — AB (ref 0.350–4.500)

## 2018-04-24 MED ORDER — METOPROLOL TARTRATE 25 MG PO TABS
25.0000 mg | ORAL_TABLET | Freq: Two times a day (BID) | ORAL | Status: DC
  Administered 2018-04-25 – 2018-04-27 (×6): 25 mg via ORAL
  Filled 2018-04-24 (×6): qty 1

## 2018-04-24 NOTE — Progress Notes (Signed)
CCMD phoned. Patient had 6 beat run of SV tachy. Paged on call.

## 2018-04-24 NOTE — Progress Notes (Addendum)
   Subjective: [name] reported feeling better today. She is still not sleeping well overnight, and her daughter reported she seems agitated til about 5 am and then falls asleep. She was alert and oriented to person, but not to place or time. She denied any overnight chest pain, chest pounding/tightness, changes in her vision, SOB, or lightheadedness. She is having a posterior occipital headache intermittently. Her daughter understood and agreed that rehab is the next best step for the patient. All questions and concerns were addressed.   Objective:  Vital signs in last 24 hours: Vitals:   04/23/18 1643 04/23/18 2004 04/24/18 0003 04/24/18 0402  BP: 131/74 (!) 187/90 (!) 153/78 (!) 117/58  Pulse: 95 91 81 83  Resp: 15 17 16 16   Temp: 98.6 F (37 C) 98.4 F (36.9 C) 98 F (36.7 C) (!) 97.5 F (36.4 C)  TempSrc: Oral Oral Oral Oral  SpO2: 96% 97% 95% 97%   Physical Exam  Constitutional: She is well-developed, well-nourished, and in no distress.  Cardiovascular: Normal rate, regular rhythm and normal heart sounds.  No murmur heard. Pulmonary/Chest: Effort normal and breath sounds normal. No respiratory distress.  Musculoskeletal: She exhibits no edema.  Skin: Skin is warm and dry.  Neurovascular: alert and oriented to person, not oriented to place or time, mild left sided facial droop, sensation intact, bilateral UE strength 4/5, left LE strength 3/5, right LE strength 4/5  Assessment/Plan:  Active Problems:   Stroke (HCC)   Paroxysmal atrial fibrillation (HCC)   Atrial fibrillation with RVR (HCC)   Non-rheumatic mitral valve stenosis  [name] is a 84 y.o female with a PMHx of CVAs, HTN, and HLD presenting with slurred speech,left sidedfacial droop and right sided weakness. [facility] dual anti[facility]let therapy andezetimibe.  Acute CVA Patient still has mild left sided facial droop. Alert and oriented to  person; disoriented to place and time. RN reported difficulty swallowing last night; repeat SLP eval pending. Will start rate control therapy with metoprolol 9/1. Start Eliquis 5 mg BID starting on 9/3. PT/OT recommending SNF at discharge. Patient and family amenable to SNF placement.  -- maintain normotensive blood pressures, per neuro -- continue aspirin 325 mg, clopidogrel 75 mg and ezetimibe 10 mg QD -- start metoprolol 9/1, most likely low dose due to concerns of hypotension   Paroxysmal Atrial Fibrillation Patient remaining in sinus rhythm. TSH elevated, ordered free t3 and t4. -- f/u free t3 and t4 -- metoprolol 5mg  IV if pt converts back into afib -- continue telemetry -- start rate control therapy on 9/1 -- start Eliquis 5 mg BID, per neuro on 9/3  Hyperlipidemia -- continue ezetimibe 10 mg QD  HTN -- maintain normotensive blood pressures, per neuro -- continuing to hold home medication atenolol 100 mg QD  Diet:NPO Code: DNR VTE Prophylaxis: lovenox   Dispo: Discharged pending on patient's clinical improvement and SNF placement.   Katelyn StandardRehman, Katelyn Bruning N, DO 04/24/2018, 6:59 AM Pager: [phone]

## 2018-04-24 NOTE — Progress Notes (Signed)
Patient pleasant, sleeping . Family at bedside.Bed alarm on. Safety maintained.

## 2018-04-24 NOTE — Progress Notes (Signed)
Internal Medicine Attending  Date: 04/24/2018  Patient name: Lizabeth LeydenDoris T Beckstrand Medical record number: [id number] Date of birth: [birth year] Age: 84 y.o. Gender: female  I saw and evaluated the patient. I reviewed the resident's note by Dr. Karilyn Cotaehman and I agree with the resident's findings and plans as documented in her progress note.  When seen on rounds this morning Ms. Suzette BattiestBowling was more alert and interactive.  Her strength was unchanged from yesterday and she continues to have a left facial droop. Upon discussion with the patient and her daughter the decision was made for skilled nursing facility at discharge for rehabilitation before returning home. We will start metoprolol 25 mg by mouth twice daily tomorrow in order to protect her from rapid ventricular rate and hypotension should she convert back to atrial fibrillation.  We will clarify with neurology whether or not they want us to continue the Plavix with the Eliquis that we are starting on the 9/3. Their note is a bit unclear on this and we want to make sure we follow the recommendations.

## 2018-04-24 NOTE — Progress Notes (Signed)
STROKE TEAM PROGRESS NOTE   SUBJECTIVE (INTERVAL HISTORY) Her daughter and great grandchildren are at the bedside.  Patient is sitting up in bed and eating lunch. She is slowly improving. Family is agreeable to consider short stay in rehabilitation   OBJECTIVE Vitals:   04/24/18 0003 04/24/18 0402 04/24/18 0829 04/24/18 1152  BP: (!) 153/78 (!) 117/58 119/73 123/68  Pulse: 81 83 74 (!) 105  Resp: 16 16 18    Temp: 98 F (36.7 C) (!) 97.5 F (36.4 C) 98.2 F (36.8 C) 98.3 F (36.8 C)  TempSrc: Oral Oral Oral Oral  SpO2: 95% 97% 96% 98%    CBC:  Recent Labs  Lab 04/17/18 2348  04/18/18 0522 04/22/18 1005 04/22/18 1014  WBC 10.0  --  9.6 10.2  --   NEUTROABS 5.4  --   --  5.3  --   HGB 12.5   < > 12.6 13.8 14.3  HCT 39.1   < > 38.5 42.9 42.0  MCV 92.0  --  92.5 92.1  --   PLT 207  --  199 199  --    < > = values in this interval not displayed.    Basic Metabolic Panel:  Recent Labs  Lab 04/17/18 2348  04/22/18 1005 04/22/18 1014  NA 140   < > 139 140  K 4.3   < > 4.1 3.8  CL 107   < > 106 106  CO2 24  --  22  --   GLUCOSE 109*   < > 142* 142*  BUN 13   < > 14 16  CREATININE 0.78   < > 1.02* 0.90  CALCIUM 9.1  --  9.0  --    < > = values in this interval not displayed.    Lipid Panel:     Component Value Date/Time   CHOL 244 (H) 04/18/2018 0522   TRIG 161 (H) 04/18/2018 0522   HDL 48 04/18/2018 0522   CHOLHDL 5.1 04/18/2018 0522   VLDL 32 04/18/2018 0522   LDLCALC 164 (H) 04/18/2018 0522   HgbA1c:  Lab Results  Component Value Date   HGBA1C 5.8 (H) 04/18/2018   Urine Drug Screen: No results found for: LABOPIA, COCAINSCRNUR, LABBENZ, AMPHETMU, THCU, LABBARB  Alcohol Level No results found for: ETH  IMAGING  Ct Angio Head W Or Wo Contrast Ct Angio Neck W Or Wo Contrast Ct Cerebral Perfusion W Contrast 04/22/2018 IMPRESSION:  1. Constellation of plain CT, CT Perfusion, and recent MRI findings consistent with a Subacute Right MCA infarct affecting  the posterior right temporal lobe and periatrial white matter.  2. Right MCA M1 severe stenosis versus recent thrombo-embolic near occlusion without improvement since the 04/18/2018 CTA, but no interval branch occlusion.  3. Subsequent prolonged perfusion time throughout the Right MCA territory, with the worst Tmax area corresponding to the subacute infarct.  4. Stable arterial findings on CTA head and neck elsewhere, no new stenosis, but other notable atherosclerotic stenosis: - supraclinoid Right ICA siphon, moderate. - Left CCA origin up to 60%. - Left ICA bulb up to 65%.    Mr Brain Wo Contrast 04/22/2018 IMPRESSION:  1. Interval worsening in multiple right MCA territory infarcts, with multiple new/larger infarcts involving the right MCA distribution as above. Infarcts primarily watershed in nature. No associated hemorrhage.  2. Otherwise stable appearance of the brain. No other acute intracranial abnormality identified.    Ct Head Code Stroke Wo Contrast 04/22/2018 IMPRESSION:  1. New/larger infarcts in  the posterior right temporal lobe and posterior right insula. No hemorrhage.  2. ASPECTS is 8.  3. Evolving early subacute infarcts in the right basal ganglia and white matter as shown on recent prior MRI.    Transthoracic Echocardiogram  04/18/2018 Study Conclusions  - Left ventricle: The cavity size was normal. Wall thickness was   normal. Systolic function was normal. The estimated ejection   fraction was in the range of 60% to 65%. The study is not   technically sufficient to allow evaluation of LV diastolic   function. - Aortic valve: Sclerosis without stenosis. - Mitral valve: Severely calcified annulus. Moderately thickened,   moderately calcified leaflets . The findings are consistent with   mild stenosis. There was mild regurgitation. Valve area by   continuity equation (using LVOT flow): 1 cm^2. - Left atrium: The atrium was moderately to severely dilated. - Atrial  septum: No defect or patent foramen ovale was identified. - Pulmonary arteries: PA peak pressure: 43 mm Hg (S).    PHYSICAL EXAM  Temp:  [97.5 F (36.4 C)-98.6 F (37 C)] 98.3 F (36.8 C) (08/31 1152) Pulse Rate:  [74-105] 105 (08/31 1152) Resp:  [15-18] 18 (08/31 0829) BP: (117-187)/(58-90) 123/68 (08/31 1152) SpO2:  [95 %-98 %] 98 % (08/31 1152)  General - pleasant elderly Caucasian lady not in distress   Ophthalmologic - fundi not visualized due to noncooperation.  Cardiovascular - Regular rate and rhythm with no murmur, not in afib.  Mental Status -  Level of arousal and orientation to place, and person were intact, but not orientated to time and age. Language including expression, naming, repetition, comprehension was assessed and found intact.  Cranial Nerves II - XII - II - Visual field intact OU, however left partial visual field cut. III, IV, VI - Extraocular movements intact. V - Facial sensation intact bilaterally. VII -mild left facial droop. VIII - Hearing & vestibular intact bilaterally. X - Palate elevates symmetrically. XI - Chin turning & shoulder shrug intact bilaterally. XII - Tongue protrusion intact.  Motor Strength - The patient's strength was normal in right upper and lower extremities, however, left upper extremity 4+/5, and left lower extremity 5/5 proximal and 4+/5 distally and pronator drift was absent.  Bulk was normal and fasciculations were absent.   Motor Tone - Muscle tone was assessed at the neck and appendages and was normal.  Reflexes - The patient's reflexes were symmetrical in all extremities and she had no pathological reflexes.  Sensory - Light touch, temperature/pinprick were assessed and were symmetrical.    Coordination - The patient had normal movements in the hands with no ataxia or dysmetria.  Tremor was absent.  Gait and Station - deferred.    ASSESSMENT/PLAN Ms. Lizabeth LeydenDoris T Reger is a 84 y.o. female with history of  HTN,  HLD, previous stroke, and atrial fib (new diagnosis) presenting with AMS. She did not receive IV t-PA due to late presentation.   Stroke:  R MCA infarcts likely due to severe right MCA stenosis in the setting of orthostatic hypotension  Resultant mild left hemiparesis  CT head - New/larger infarcts in the posterior right temporal lobe and posterior right insula.  MRI head - Interval worsening in multiple right MCA territory infarcts.  CTA H&N - Right MCA M1 severe stenosis versus near occlusion   VTE prophylaxis - Lovenox 40 mg daily  Diet - Heart healthy / carb modified with thin liquids.  aspirin 325 mg daily and clopidogrel 75 mg daily  prior to admission, now on aspirin 325 mg daily and clopidogrel 75 mg daily.  Given A. fib and right MCA stroke extension, recommend to initiate Eliquis 5 mg twice daily at 5 days post stroke on 04/27/18.  Patient counseled to be compliant with her antithrombotic medications  Ongoing aggressive stroke risk factor management  Therapy recommendations: SNF  Disposition:  Pending  Recent stroke 04/17/18  Admitted for speech and gait difficulties  MRI right MCA territory scattered small/punctate infarct  CT head and neck right M1 6 mm lens segment of near occlusion, severe thread-like stenosis  LE venous Doppler no DVT  2D Echo - 04/18/2018 - EF 60 - 65% No defect or patent foramen ovale was identified.  LDL 164  A1c 5.8  Put on aspirin 325 and Plavix 75 DAPT for 3 months  Discharge home with home PT/OT  PAF  EKG confirmed PAF on this admission  Will cancel 30-day CardioNet monitoring ordered on last admission  Currently NSR  Recommend anticoagulation with Eliquis 5 mill grams twice daily at 5 days post stroke on 04/27/2018  Orthostatic hypotension  Patient sister admitted patient had low blood pressure recently at home  Before the fall at this time, patient failed dizziness/lightheadedness  Likely the cause of stroke extension  this time  TED hose placement  Orthostatic vital with PT/OT - pending  Right MCA high-grade stenosis  Likely because of the stroke in the setting of hypotension  Discussed with sister and daughter at bedside regarding pros and cons of stent placement  Patient daughter seems not interested for stent placement at this time, but open to discuss in the future.  Hyperlipidemia  Lipid lowering medication PTA: Zetia 10 mg daily  LDL 164, goal < 70  Current lipid lowering medication: Zetia 10 mg daily - consider change to Lipitor.  Patient has statin intolerance  Recommend to consider PCSK9 inhibitors as outpatient  Other Stroke Risk Factors  Advanced age  Former cigarette smoker - quit  Other Active Problems  4 mm right upper lobe pulmonary nodule on CTA. Continue outpt follow up.   Statin intolerance  Hospital day # 2 Avoid hypotension and keep systolic blood pressure 120/140 range continue aspirin and Plavix and to 3 months followed by Plavix alone. Aggressive risk factor modification. Transfer to rehabilitation when bed available. Stroke team will sign off. Kindly call for questions. I spent  35 minutes in total face-to-face time with the patient, more than 50% of which was spent in counseling and coordination of care, reviewing test results, images and medication, and discussing the diagnosis of Recurrent stroke, right MCA high-grade stenosis, paroxysmal A. fib, and orthostatic hypotension, treatment plan and potential prognosis. This patient's care requiresreview of multiple databases, neurological assessment, discussion with family, other specialists and medical decision making of high complexity. I had long discussion with daughter and sister at bedside, updated pt current condition, treatment plan and potential prognosis. They expressed understanding and appreciation.    Delia Heady, MD Stroke Neurology 04/24/2018 1:30 PM    To contact Stroke Continuity provider,  please refer to [email or web address]. After hours, contact General Neurology

## 2018-04-24 NOTE — Evaluation (Signed)
Clinical/Bedside Swallow Evaluation Patient Details  Name: Katelyn Mckinney MRN: [id number] Date of Birth: 1925/09/26  Today's Date: 04/24/2018 Time: SLP Start Time (ACUTE ONLY): 1021 SLP Stop Time (ACUTE ONLY): 1040 SLP Time Calculation (min) (ACUTE ONLY): 19 min  Past Medical History:  Past Medical History:  Diagnosis Date  . Atrial fibrillation (HCC) 04/22/2018  . CVA (cerebral vascular accident) (HCC)   . Diverticulitis   . GERD (gastroesophageal reflux disease)   . Hyperlipidemia   . Hypertension    Past Surgical History:  Past Surgical History:  Procedure Laterality Date  . CARPAL TUNNEL RELEASE    . CATARACT EXTRACTION, BILATERAL    . HEMORROIDECTOMY    . KNEE ARTHROPLASTY     HPI:  Pt is an 84 y/o female with a history of cerebrovascular accident of the right MCA territory last week, hypertension, and hyperlipidemia who presents after a fall with associated facial droop. Pt found to have an extension of a recent watershed CVA secondary to an M1 branch middle cerebral artery stenosis. Pt passed RN stroke swallow screen, swallow evaluation ordered as RN reported pocketing of food.   Assessment / Plan / Recommendation Clinical Impression   Patient presents with mild oral dysphagia due to left sided facial weakness, exacerbated by cognitive deficits. Mastication is prolonged (pt's baseline, per daughter), and pt noted with pocketing of solids on the left with poor awareness. Verbal cues required for lingual sweep to clear residue. Pt's sister reports she noticed pt had a whole carrot slice in her left cheek yesterday about 30 minutes after meal completed. Pharyngeal stage appears swift and strong. There were no overt signs of aspiration with 3 oz water swallow challenge. Recommend dys 2, thin liquids, full supervision due to level of cuing necessary for pocketing. SLP will follow for upgraded texture trials and training in compensatory techniques.    SLP Visit Diagnosis:  Dysphagia, oral phase (R13.11)    Aspiration Risk  Mild aspiration risk    Diet Recommendation Dysphagia 2 (Fine chop);Thin liquid   Liquid Administration via: Cup;Straw Medication Administration: Whole meds with liquid Supervision: Patient able to self feed;Full supervision/cueing for compensatory strategies(supervision due to cognitive deficits and left pocketing) Compensations: Minimize environmental distractions;Slow rate;Small sips/bites;Lingual sweep for clearance of pocketing Postural Changes: Seated upright at 90 degrees    Other  Recommendations Oral Care Recommendations: Oral care BID   Follow up Recommendations Other (comment)(tba)      Frequency and Duration min 1 x/week  1 week       Prognosis Prognosis for Safe Diet Advancement: Good Barriers to Reach Goals: Cognitive deficits      Swallow Study   General Date of Onset: 04/22/18 HPI: Pt is an 84 y/o female with a history of cerebrovascular accident of the right MCA territory last week, hypertension, and hyperlipidemia who presents after a fall with associated facial droop. Pt found to have an extension of a recent watershed CVA secondary to an M1 branch middle cerebral artery stenosis. Pt passed RN stroke swallow screen, swallow evaluation ordered as RN reported pocketing of food. Type of Study: Bedside Swallow Evaluation Previous Swallow Assessment: passed stroke swallow screen Diet Prior to this Study: NPO Temperature Spikes Noted: No Respiratory Status: Room air History of Recent Intubation: No Behavior/Cognition: Alert;Cooperative;Pleasant mood;Confused Oral Cavity Assessment: Within Functional Limits Oral Care Completed by SLP: No Vision: Functional for self-feeding Self-Feeding Abilities: Able to feed self Patient Positioning: Upright in bed Baseline Vocal Quality: Normal Volitional Cough: Strong Volitional Swallow: Able to  elicit    Oral/Motor/Sensory Function Overall Oral Motor/Sensory Function:  Mild impairment Facial ROM: Reduced left;Suspected CN VII (facial) dysfunction Facial Symmetry: Abnormal symmetry left;Suspected CN VII (facial) dysfunction Facial Strength: Reduced left;Suspected CN VII (facial) dysfunction Facial Sensation: Within Functional Limits Lingual ROM: Within Functional Limits Lingual Symmetry: Within Functional Limits Lingual Strength: Within Functional Limits Velum: Within Functional Limits Mandible: Within Functional Limits   Ice Chips Ice chips: Within functional limits   Thin Liquid Thin Liquid: Within functional limits Presentation: Cup;Straw;Self Fed    Nectar Thick Nectar Thick Liquid: Not tested   Honey Thick Honey Thick Liquid: Not tested   Puree Puree: Within functional limits Presentation: Self Fed;Spoon   Solid     Solid: Impaired Presentation: Self Fed Oral Phase Impairments: Impaired mastication Oral Phase Functional Implications: Left lateral sulci pocketing;Prolonged oral transit;Impaired mastication     Rondel BatonMary Beth Mabelle Mungin, MS, CCC-SLP Speech-Language Pathologist Acute Rehabilitation Services Pager: [phone] Office: [phone]  Arlana LindauMary E Jakiya Bookbinder 04/24/2018,10:46 AM

## 2018-04-25 LAB — T3, FREE: T3, Free: 2.2 pg/mL (ref 2.0–4.4)

## 2018-04-25 MED ORDER — POLYVINYL ALCOHOL 1.4 % OP SOLN
1.0000 [drp] | Freq: Four times a day (QID) | OPHTHALMIC | Status: DC | PRN
  Filled 2018-04-25 (×2): qty 15

## 2018-04-25 MED ORDER — SODIUM CHLORIDE 0.9 % IV BOLUS
500.0000 mL | Freq: Once | INTRAVENOUS | Status: AC
  Administered 2018-04-25: 500 mL via INTRAVENOUS

## 2018-04-25 NOTE — Progress Notes (Addendum)
   Subjective: Katelyn Mckinney was seen on rounds this morning. She slept better last night and was still sleeping on and off when seen. She did wake up and participate in exam with prompting. Endorsed a sore throat overnight, but otherwise has no complaints this morning and denies other pain. All questions and concerns were addressed.   Objective:  Vital signs in last 24 hours: Vitals:   04/24/18 1539 04/24/18 1958 04/24/18 2350 04/25/18 0334  BP: 120/66 (!) 134/57 98/81 112/60  Pulse: (!) 108 95 90 (!) 106  Resp:  18 18 18   Temp: (!) 97.4 F (36.3 C) 97.7 F (36.5 C) 97.7 F (36.5 C) (!) 97.4 F (36.3 C)  TempSrc: Oral Oral Oral Oral  SpO2: 96% 97% 90% 92%   Physical Exam  Constitutional: She is well-developed, well-nourished, and in no distress.  Cardiovascular: Normal rate, regular rhythm and normal heart sounds.  No murmur heard. Pulmonary/Chest: Effort normal and breath sounds normal. No respiratory distress.  Musculoskeletal: She exhibits no edema.  Skin: Skin is warm and dry.  Neurovascular: Alert and oriented to Person, Not oriented to place or time, Mild left sided facial droop, sensation intact, bilateral UE strength 4/5, left LE strength 3/5, right LE strength 4/5  Assessment/Plan:  Active Problems:   Stroke (HCC)   Paroxysmal atrial fibrillation (HCC)   Atrial fibrillation with RVR (HCC)   Non-rheumatic mitral valve stenosis  Katelyn Mckinney is a 84 y.o female with a PMHx of CVAs, HTN, and HLD presenting with slurred speech,left sidedfacial droop and right sided weakness. Patient was recently discharged from [facility] right MCA territory CVAon dual antiplatelet therapy andezetimibe.  Acute CVA Patient still has mild left sided facial droop. Alert and oriented to person only. Following SLP eval, patient is now on Dys 2 Diet. Starting rate control therapy with metoprolol today. Will Start Eliquis 5 mg BID starting on 9/3. PT/OT recommending SNF at discharge (SW consult  placed). Patient and family amenable to SNF placement.  - maintain normotensive blood pressures, per neuro -- Giving 500cc Bolus given Systolic BP 90-110s overnight - continue aspirin 325 mg, clopidogrel 75 mg <-- Will discontinue these on 9/3 with initiation on eliquis - Continue ezetimibe 10 mg QD  Paroxysmal Atrial Fibrillation Patient remaining in sinus rhythm. TSH elevated, ordered free t3 and t4. - T4 WNL, T3 Pending - metoprolol 5mg  IV if pt converts back into afib - continue telemetry - Started metoprolol 25mg  BID, will switch to 12.5mg  BID given soft BP - start Eliquis 5 mg BID, per neuro on 9/3  Hyperlipidemia - continue ezetimibe 10 mg QD  HTN (Systolic 90-110s overnight) - maintain normotensive blood pressures, per neuro - continuing to hold home medication atenolol 100 mg QD - 500cc NS bolus   Diet:Dys 2 Code: DNR VTE Prophylaxis: lovenox  Dispo: Discharged pending on patient's clinical improvement and SNF placement.   Beola Cord, MD 04/25/2018, 6:19 AM Pager: [phone]

## 2018-04-25 NOTE — Clinical Social Work Note (Signed)
Clinical Social Work Assessment  Patient Details  Name: Katelyn Mckinney MRN: [id number] Date of Birth: Oct 12, 1925  Date of referral:  04/24/18               Reason for consult:  Facility Placement                Permission sought to share information with:  Family Supports Permission granted to share information::     Name::     Radio broadcast assistant::     Relationship::  Daughter  Contact Information:     Housing/Transportation Living arrangements for the past 2 months:  Single Family Home Source of Information:  Adult Children Patient Interpreter Needed:  None Criminal Activity/Legal Involvement Pertinent to Current Situation/Hospitalization:  No - Comment as needed Significant Relationships:  Adult Children Lives with:  Self Do you feel safe going back to the place where you live?  No Need for family participation in patient care:  No (Coment)  Care giving concerns:  Pt is only alert to self. CSW spoke with pt's daughter via telephone.   Social Worker assessment / plan:  CSW spoke with pt's daughter via telephone. Pt's daughter is aware PT recommended SNF at d/c. Pt's daughter is agreeable to [facility] f/o. Pt is from [place] but will go to rehab in [place] since her only support (her daughter) is in [place]. The ultimate goal would be to get pt back to [place] after rehab however, pt's daughter aware pt may need to stay with her.   Employment status:  Retired Database administrator PT Recommendations:  Skilled Nursing Facility Information / Referral to community resources:  Skilled Nursing Facility  Patient/Family's Response to care:  Pt's daughter verbalized understanding of CSW role and expressed appreciation for support. Pt's daughter denies any concern regarding pt care at this time.   Patient/Family's Understanding of and Emotional Response to Diagnosis, Current Treatment, and Prognosis:  Pt's daughter understanding and realistic regarding  pt's physical limitations. Pt's daughter understands the need for pt to go to SNF at d/c. Pt denies any concern regarding pt's  treatment plan at this time. CSW will continue to provide support and facilitate d/c needs.   Emotional Assessment Appearance:  Appears stated age Attitude/Demeanor/Rapport:  Unable to Assess Affect (typically observed):  Unable to Assess Orientation:  Oriented to Self Alcohol / Substance use:  Not Applicable Psych involvement (Current and /or in the community):  No (Comment)  Discharge Needs  Concerns to be addressed:  Basic Needs Readmission within the last 30 days:  Yes Current discharge risk:  Dependent with Mobility Barriers to Discharge:  Continued Medical Work up   [employer], LCSW 04/25/2018, 2:00 PM

## 2018-04-26 DIAGNOSIS — M542 Cervicalgia: Secondary | ICD-10-CM

## 2018-04-26 DIAGNOSIS — M549 Dorsalgia, unspecified: Secondary | ICD-10-CM

## 2018-04-26 DIAGNOSIS — G4700 Insomnia, unspecified: Secondary | ICD-10-CM

## 2018-04-26 DIAGNOSIS — R410 Disorientation, unspecified: Secondary | ICD-10-CM

## 2018-04-26 MED ORDER — ACETAMINOPHEN 325 MG PO TABS: 650.0000 mg | ORAL_TABLET | ORAL | Status: DC | PRN

## 2018-04-26 MED ORDER — ACETAMINOPHEN 160 MG/5ML PO SOLN: 650.0000 mg | ORAL | Status: DC | PRN

## 2018-04-26 MED ORDER — ACETAMINOPHEN 325 MG PO TABS
650.0000 mg | ORAL_TABLET | Freq: Every day | ORAL | Status: DC
  Administered 2018-04-26 – 2018-04-27 (×2): 650 mg via ORAL
  Filled 2018-04-26 (×2): qty 2

## 2018-04-26 MED ORDER — ACETAMINOPHEN 650 MG RE SUPP: 650.0000 mg | RECTAL | Status: DC | PRN

## 2018-04-26 NOTE — Progress Notes (Signed)
  Speech Language Pathology Treatment: Dysphagia;Cognitive-Linquistic  Patient Details Name: Katelyn Mckinney MRN: [id number] DOB: 1926-03-31 Today's Date: 04/26/2018 Time: 1031-2811 SLP Time Calculation (min) (ACUTE ONLY): 40 min  Assessment / Plan / Recommendation Clinical Impression  Skilled treatment session focused dysphagia, cognition and education. Pt's daughter present and reports pt coughing with thin liquids via cup during meals. SLP provided skilled observation of pt consuming thin water via cup sips. Pt with 1 initial cough (good cough strength) and no vocal wetness. Pt consumed 6 oz thin water via cup sips (pt holding cup) with no overt s/s of aspiration - however pt demonstrated intermittent double swallow with several sips (possibly d/t increased bolus size with that sip). Given report by family of coughing with thin liquids, trials of nectar thick liquids given by cup. No overt s/s of aspiration. Further trials of thin liquids administered (another 4 oz) via cup sips with no overt s/s of aspiration. Pt required Min A cues to sustain attention to task and is very distractible, which could impact swallow ability when family present. Pt's family reports continued left pocketing even with finely minced food. Recommend downgrade to puree diet. While coughing with thin liquids, pt appears to be protecting her airway well as she doesn't have any s/s of respiratory compromise. Education provided to MD and pt's daughter of recommendation to leave on thin liquids with close observation by nursing for any changes in respiratory status. Education provided to pt's nurse as well.    HPI HPI: Pt is an 84 y/o female with a history of cerebrovascular accident of the right MCA territory last week, hypertension, and hyperlipidemia who presents after a fall with associated facial droop. Pt found to have an extension of a recent watershed CVA secondary to an M1 branch middle cerebral artery stenosis. Pt passed  RN stroke swallow screen, swallow evaluation ordered as RN reported pocketing of food.      SLP Plan  Continue with current plan of care       Recommendations  Diet recommendations: Dysphagia 1 (puree);Thin liquid Liquids provided via: Cup Medication Administration: Whole meds with puree Supervision: Staff to assist with self feeding;Full supervision/cueing for compensatory strategies Compensations: Minimize environmental distractions;Slow rate;Small sips/bites;Lingual sweep for clearance of pocketing Postural Changes and/or Swallow Maneuvers: Seated upright 90 degrees                Oral Care Recommendations: Oral care BID Follow up Recommendations: Skilled Nursing facility SLP Visit Diagnosis: Dysphagia, oropharyngeal phase (R13.12);Cognitive communication deficit (R41.841) Plan: Continue with current plan of care       GO                Kinsley Nicklaus 04/26/2018, 10:34 AM

## 2018-04-26 NOTE — Progress Notes (Signed)
Physical Therapy Treatment Patient Details Name: Katelyn Mckinney MRN: [id number] DOB: 02-07-1926 Today's Date: 04/26/2018    History of Present Illness Pt is an 84 y/o female with a history of cerebrovascular accident of the right MCA territory last week, hypertension, and hyperlipidemia who presents after a fall with associated facial droop. Pt found to have an extension of a recent watershed CVA secondary to an M1 branch middle cerebral artery stenosis.    PT Comments    Patient seen for mobility progression. This session focused on functional transfers and sitting/standing balance. Pt continues to present with L side weakness (UE>LE) and L side inattention. Pt stood X4 with +2 assistance using RW and Stedy standing frame. Continue to progress as tolerated with anticipated d/c to SNF for further skilled PT services.      Follow Up Recommendations  SNF     Equipment Recommendations  None recommended by PT    Recommendations for Other Services       Precautions / Restrictions Precautions Precautions: Fall Precaution Comments: L inattention Restrictions Weight Bearing Restrictions: No    Mobility  Bed Mobility               General bed mobility comments: pt OOB in chair upon arrival  Transfers Overall transfer level: Needs assistance Equipment used: Rolling walker (2 wheeled);2 person hand held assist Transfers: Sit to/from Stand Sit to Stand: Max assist;+2 physical assistance;Mod assist         General transfer comment: pt stood with mod A +2 and requires max A +2 to maintain balance; pt assists with R side and required cues and assistance to protect L UE; multimodal cues for forward gaze, posture, and weight shifting for midline posture   Ambulation/Gait                 Stairs             Wheelchair Mobility    Modified Rankin (Stroke Patients Only) Modified Rankin (Stroke Patients Only) Pre-Morbid Rankin Score: Moderate disability Modified  Rankin: Severe disability     Balance Overall balance assessment: Needs assistance Sitting-balance support: Feet supported Sitting balance-Leahy Scale: Fair     Standing balance support: During functional activity;Bilateral upper extremity supported Standing balance-Leahy Scale: Poor                              Cognition Arousal/Alertness: Awake/alert Behavior During Therapy: Flat affect Overall Cognitive Status: Impaired/Different from baseline Area of Impairment: Following commands;Safety/judgement;Problem solving;Attention                   Current Attention Level: Focused   Following Commands: Follows one step commands with increased time;Follows one step commands inconsistently Safety/Judgement: Decreased awareness of deficits;Decreased awareness of safety   Problem Solving: Slow processing;Difficulty sequencing;Requires verbal cues        Exercises      General Comments General comments (skin integrity, edema, etc.): sister present; L side inattention      Pertinent Vitals/Pain Pain Assessment: No/denies pain    Home Living                      Prior Function            PT Goals (current goals can now be found in the care plan section) Acute Rehab PT Goals PT Goal Formulation: With patient/family Time For Goal Achievement: 05/07/18 Potential to Achieve Goals: Good Progress  towards PT goals: Progressing toward goals    Frequency    Min 4X/week      PT Plan Current plan remains appropriate    Co-evaluation              AM-PAC PT "6 Clicks" Daily Activity  Outcome Measure  Difficulty turning over in bed (including adjusting bedclothes, sheets and blankets)?: Unable Difficulty moving from lying on back to sitting on the side of the bed? : Unable Difficulty sitting down on and standing up from a chair with arms (e.g., wheelchair, bedside commode, etc,.)?: Unable Help needed moving to and from a bed to chair  (including a wheelchair)?: A Lot Help needed walking in hospital room?: Total Help needed climbing 3-5 steps with a railing? : Total 6 Click Score: 7    End of Session Equipment Utilized During Treatment: Gait belt Activity Tolerance: Patient tolerated treatment well Patient left: in chair;with call bell/phone within reach;with family/visitor present;with chair alarm set Nurse Communication: Mobility status PT Visit Diagnosis: Other abnormalities of gait and mobility (R26.89);Other symptoms and signs involving the nervous system (R29.898)     Time: 1610-9604 PT Time Calculation (min) (ACUTE ONLY): 24 min  Charges:  $Therapeutic Activity: 23-37 mins                     Erline Levine, PTA Pager: [phone]     Carolynne Edouard 04/26/2018, 4:31 PM

## 2018-04-26 NOTE — Progress Notes (Signed)
Internal Medicine Attending  Date: 04/26/2018  Patient name: Katelyn Mckinney Medical record number: [id number] Date of birth: 1926/04/22 Age: 84 y.o. Gender: female  I saw and evaluated the patient. I reviewed the resident's note by Dr. Dortha Schwalbe and I agree with the resident's findings and plans as documented in his progress note.  When seen on rounds this morning Katelyn Mckinney was in an excellent mood. She spent most of our visit cracking jokes. According to her family she had a restless night with poor sleep. She kept picking at her gown. The reason for this is reportedly continued pain in the back of the neck and head. Although when necessary Tylenol was on the books it was not called for. We believe it is important for her to get sleep at night to avoid such delirium. We have therefore scheduled the Tylenol at 650 mg daily at bedtime. We are hopeful this will address this pain, and we have asked the family to keep her awake during the day in order to make sure she has day-night cycles that are appropriate. We will discontinue the atenolol altogether now that we have her on the metoprolol. We wanted to make sure her blood pressure remains normotensive with systolics at 130-140 range given her critical stenosis in the M1 branch of the MCA, the presumed culprit lesion for her watershed infarction.  She is scheduled to start the Eliquis tomorrow, at which point we will stop the Plavix and aspirin.  A skilled nursing facility has been chosen and because it is the holiday we are unable to get an authorization until tomorrow. Once this is completed we are hopeful she will be transferred to [facility] for continued rehabilitation.

## 2018-04-26 NOTE — Progress Notes (Signed)
CSW provided bed offers to patient's daughter, Elease Hashimoto. Daughter chooses [facility]. CSW to start [facility] authorization tomorrow morning; unable to complete auth today due to holiday.  Abigail Butts, LCSWA [phone]

## 2018-04-26 NOTE — NC FL2 (Addendum)
[place] MEDICAID FL2 LEVEL OF CARE SCREENING TOOL     IDENTIFICATION  Patient Name: Katelyn Mckinney Birthdate: [birth year] Sex: female Admission Date (Current Location): 04/22/2018  [facility] and [place] Number:  Producer, television/film/video and Address:  [facility], [address] [place], [place] [address]      Provider Number: [id number]  Attending Physician Name and Address:  Doneen Poisson, MD  Relative Name and Phone Number:       Current Level of Care: Hospital Recommended Level of Care: Skilled Nursing Facility Prior Approval Number:    Date Approved/Denied:   PASRR Number: [id number] (valid 9/3 - 05/18/18)  Discharge Plan: SNF    Current Diagnoses: Patient Active Problem List   Diagnosis Date Noted  . Paroxysmal atrial fibrillation (HCC) 04/23/2018  . Atrial fibrillation with RVR (HCC)   . Non-rheumatic mitral valve stenosis   . Stroke (HCC) 04/22/2018  . Acute ischemic right MCA stroke (HCC)   . Essential hypertension   . Pure hypercholesterolemia   . Solitary pulmonary nodule   . CVA (cerebral vascular accident) (HCC) 04/18/2018    Orientation RESPIRATION BLADDER Height & Weight     Self  Normal Continent Weight:   Height:     BEHAVIORAL SYMPTOMS/MOOD NEUROLOGICAL BOWEL NUTRITION STATUS      Continent Diet(DYS 2, thin liquids)  AMBULATORY STATUS COMMUNICATION OF NEEDS Skin   Extensive Assist Verbally Normal                       Personal Care Assistance Level of Assistance  Bathing, Feeding, Dressing Bathing Assistance: Maximum assistance Feeding assistance: Independent Dressing Assistance: Maximum assistance     Functional Limitations Info  Sight, Hearing, Speech Sight Info: Adequate Hearing Info: Adequate Speech Info: Adequate    SPECIAL CARE FACTORS FREQUENCY  PT (By licensed PT), OT (By licensed OT)     PT Frequency: 4x OT Frequency: 4x            Contractures Contractures Info: Not present     Additional Factors Info  Psychotropic Code Status Info: DNR Allergies Info: Latex Psychotropic Info: Ativan         Current Medications (04/26/2018):  This is the current hospital active medication list Current Facility-Administered Medications  Medication Dose Route Frequency Provider Last Rate Last Dose  .  stroke: mapping our early stages of recovery book   Does not apply Once Nyra Market, MD      . acetaminophen (TYLENOL) tablet 650 mg  650 mg Oral Q4H PRN Nyra Market, MD   650 mg at 04/24/18 2300   Or  . acetaminophen (TYLENOL) solution 650 mg  650 mg Per Tube Q4H PRN Nyra Market, MD       Or  . acetaminophen (TYLENOL) suppository 650 mg  650 mg Rectal Q4H PRN Nyra Market, MD      . aspirin EC tablet 325 mg  325 mg Oral Daily Nyra Market, MD   325 mg at 04/25/18 0828  . clopidogrel (PLAVIX) tablet 75 mg  75 mg Oral Daily Nyra Market, MD   75 mg at 04/25/18 0828  . enoxaparin (LOVENOX) injection 40 mg  40 mg Subcutaneous Q24H Nyra Market, MD   40 mg at 04/25/18 2215  . ezetimibe (ZETIA) tablet 10 mg  10 mg Oral Daily Nyra Market, MD   10 mg at 04/25/18 0828  . famotidine (PEPCID) tablet 10 mg  10 mg Oral Daily PRN Nyra Market,  MD      . LORazepam (ATIVAN) tablet 0.5 mg  0.5 mg Oral QHS Nyra Market, MD   0.5 mg at 04/25/18 2214  . metoprolol tartrate (LOPRESSOR) tablet 25 mg  25 mg Oral BID Beola Cord, MD   25 mg at 04/25/18 2215  . polyvinyl alcohol (LIQUIFILM TEARS) 1.4 % ophthalmic solution 1 drop  1 drop Both Eyes QID PRN Doneen Poisson, MD      . senna-docusate (Senokot-S) tablet 1 tablet  1 tablet Oral QHS PRN Nyra Market, MD   1 tablet at 04/25/18 1725     Discharge Medications: Please see discharge summary for a list of discharge medications.  Relevant Imaging Results:  Relevant Lab Results:   Additional Information SSN: [id number]  Abigail Butts, LCSW

## 2018-04-26 NOTE — Care Management Important Message (Signed)
Important Message  Patient Details  Name: Katelyn Mckinney MRN: [id number] Date of Birth: 1926/05/27   Medicare Important Message Given:  Yes    Dorena Bodo 04/26/2018, 3:16 PM

## 2018-04-26 NOTE — Progress Notes (Signed)
Subjective:  Overnight: Daughter and family member at bedside reports episodes of delirium with patient began taking of her hospital gown.  They also reported insomnia.  Today, [name] was examined at bedside with family members present.  Her only complaint this morning is achiness at the back of her neck that prevented her from sleeping last night.  She had scheduled Tylenol but has not received it yet.  The pain was present prior to admission and has been constant.  She currently denies chest pain, shortness of breath, palpitation, headaches, vision changes, abdominal pain, nausea or vomiting.  The daughter and other family members at bedside reports decreased p.o. Intake and choking yesterday afternoon when she began eating however was able to tolerate her morning oatmeal with no difficulty.  Objective:  Vital signs in last 24 hours: Vitals:   04/25/18 2351 04/26/18 0339 04/26/18 0821 04/26/18 1100  BP: 136/76 130/66 (!) 144/65   Pulse: 86 89 96   Resp: 18 18 18    Temp: 98.7 F (37.1 C) (!) 97.5 F (36.4 C) 97.9 F (36.6 C)   TempSrc: Oral Oral Oral   SpO2: 98% 98% 96%   Height:    5\' 5"  (1.651 m)   Constitutional: In no acute distress, pleasantly lying in bed and very engaged HEENT: Atraumatic, normocephalic, paraspinal cervical muscle mildly tender to palpation Cardiovascular: Normal S1 and S2.  No murmurs, gallops, rubs Respiratory: Clear to auscultation bilaterally.  No wheezes, crackles, rhonchi Abdomen: Bowel sounds present, nondistended, nontender to palpation Neurological: Cranial nerves II - XII intact with exception below - Alert and oriented to person only - Mild left facial droop - Motor strength: 4/5 BL UE, 4/5 LLE, 5/5 RLE  - Patient was coordination at the left upper extremity   Assessment/Plan:  Active Problems:   Stroke [facility])   Paroxysmal atrial fibrillation (HCC)   Atrial fibrillation with RVR (HCC)   Non-rheumatic mitral valve stenosis  Ms.  Mckinney is an 84 year old Caucasian female with medical history significant for previous CVAs, hypertension, hyperlipidemia, newly diagnosed paroxysmal atrial fibrillation currently being managed for right MCA stroke.  Right MCA infarction: Patient with right MCA M1 infarction with continued mild left-sided facial droop, abnormal head to nose coordination on the left and slowly improving motor strength of lower extremities.  Per family members at bedside, she has had decreased p.o. intake with episodes of choking but was able to tolerate her morning meal with no difficulty.  She has been closely followed by SLP has recommended to continue dysphagia 1 pill he, thin liquid diet.  Per neurology, patient is to start Eliquis 5 mg twice daily on 9/3.  PT/OT recommended SNF placement at discharge which social work is currently following. - Appreciate neurology recommendation to maintain normotensive BP -Continue aspirin 325 mg, clopidogrel 75 mg both to be discontinued on 9/3 when Eliquis is initiated. -Continue Zetia 10 mg daily. - Delirium precautions and encouraged family members to keep her engaged during the day in order to give her a good nights rest  Paroxysmal atrial fibrillation: Patient remains in sinus rhythm and currently on metoprolol 12.5 mg twice a day.  Plan is to continue telemetry monitoring and to start Eliquis 5 mg twice daily on 9/3.  In the event that patient converts into A. fib, will initiate IV metoprolol 5 mg.  Hyperlipidemia: Currently on Zetia 10 mg daily as she reportedly has statin intolerance.  Neurology recommends to start PCSK9 inhibitor as outpatient  Hypertension: Current BP of 144/65 (  115-144/65-76).  She is to maintain a normotensive blood pressure as her previous worsening stroke was thought to be secondary to decrease cerebral perfusion.  Continue to hold home atenolol 100 mg daily and monitor.  Neck ache: She continues to endorse achiness at the back of her neck which  prevented her from sleeping overnight.  On physical exam, she had a mild paraspinal cervical muscle tenderness.  She had PRN Tylenol but has not received.  Plan is to schedule Tylenol 650 mg p.o. at bedtime.  Diet:Dysphagia 1 diet Code: DNR VTE Prophylaxis: lovenox  Dispo: Discharged pending on patient's clinical improvement and SNF placement.   Katelyn Rack, MD 04/26/2018, 11:23 AM Pager: [phone] IMTS PGY-1

## 2018-04-27 ENCOUNTER — Other Ambulatory Visit: Payer: Self-pay

## 2018-04-27 DIAGNOSIS — Z91040 Latex allergy status: Secondary | ICD-10-CM

## 2018-04-27 DIAGNOSIS — Z7901 Long term (current) use of anticoagulants: Secondary | ICD-10-CM

## 2018-04-27 DIAGNOSIS — G8194 Hemiplegia, unspecified affecting left nondominant side: Secondary | ICD-10-CM

## 2018-04-27 MED ORDER — SODIUM CHLORIDE 0.9 % IV BOLUS
500.0000 mL | Freq: Once | INTRAVENOUS | Status: AC
  Administered 2018-04-27: 500 mL via INTRAVENOUS

## 2018-04-27 MED ORDER — METOPROLOL TARTRATE 25 MG PO TABS: 25.0000 mg | ORAL_TABLET | Freq: Two times a day (BID) | ORAL | 3 refills | Status: DC

## 2018-04-27 MED ORDER — APIXABAN 5 MG PO TABS: 5.0000 mg | ORAL_TABLET | Freq: Two times a day (BID) | ORAL | Status: DC

## 2018-04-27 MED ORDER — APIXABAN 5 MG PO TABS: 5.0000 mg | ORAL_TABLET | Freq: Two times a day (BID) | ORAL | 3 refills | Status: AC

## 2018-04-27 MED ORDER — ACETAMINOPHEN 325 MG PO TABS: 650.0000 mg | ORAL_TABLET | Freq: Every day | ORAL | 0 refills | Status: AC

## 2018-04-27 NOTE — Progress Notes (Signed)
Pt had BP of 103/51, able to get a verbal response from pt only after painful stimuli, pt verbalized name and name of hospital, however, would not open eyes, and quickly drifted back to sleep. Notified Internal Medicine provider of change. Katelyn Mckinney

## 2018-04-27 NOTE — Progress Notes (Addendum)
Patient will discharge to [facility] Anticipated discharge date: 04/27/18 Family notified: Felipa Evener, daughter Transportation by: Sharin Mons  Nurse to call report to [phone]. Patient will go to room 705 at the facility.   CSW signing off.  Abigail Butts, LCSWA  Clinical Social Worker

## 2018-04-27 NOTE — Progress Notes (Addendum)
Patient BP 110/60 manually.  Patient A&O x4.  Patient able to have a full conversation with appropriate responds.  Patient did advise that she had been sleeping good   RN notified provider on call  RN will continue to monitor patient.

## 2018-04-27 NOTE — Progress Notes (Addendum)
4:31 pm Received PASRR [id number]. Patient ready for discharge.   3:27 pm CSW received [facility] authorization for patient, Berkley Harvey #[id number]. Updated Malvin Johns on auth. Patient's PASRR still pending. Patient requires PASRR before admitting to facility.  8:19 am CSW started [facility] authorization request for SNF. Awaiting determination. Patient's PASRR is under manual review, as patient is a [place] resident. Patient requires PASRR and insurance auth before admitting to facility. CSW to follow.  Abigail Butts, LCSWA [phone]

## 2018-04-27 NOTE — Progress Notes (Signed)
Physical Therapy Treatment Patient Details Name: Katelyn Mckinney MRN: [id number] DOB: Jan 15, 1926 Today's Date: 04/27/2018    History of Present Illness Pt is an 84 y/o female with a history of cerebrovascular accident of the right MCA territory last week, hypertension, and hyperlipidemia who presents after a fall with associated facial droop. Pt found to have an extension of a recent watershed CVA secondary to an M1 branch middle cerebral artery stenosis.    PT Comments    Patient seen for mobility progression. Pt worked on sitting/standing balance and functional transfers. Pt is able to sit to stand with mod A +2 and stand pivot with max A +2. Daughter and sister present. Continue to progress as tolerated with anticipated d/c to SNF for further skilled PT services.     Follow Up Recommendations  SNF     Equipment Recommendations  None recommended by PT    Recommendations for Other Services       Precautions / Restrictions Precautions Precautions: Fall Precaution Comments: L inattention Restrictions Weight Bearing Restrictions: No    Mobility  Bed Mobility Overal bed mobility: Needs Assistance Bed Mobility: Supine to Sit     Supine to sit: Max assist;+2 for physical assistance;+2 for safety/equipment     General bed mobility comments: mulimodal cues for sequencing and assistance required to bring L LE/hips to EOB and to elevate trunk into sitting; pt used bed rail to assist with R UE   Transfers Overall transfer level: Needs assistance Equipment used: 2 person hand held assist Transfers: Sit to/from Stand Sit to Stand: +2 physical assistance;Mod assist Stand pivot transfers: Max assist;+2 physical assistance;+2 safety/equipment       General transfer comment: pt able to stand from EOB and recliner with mod A +2 and stand pivot with max A +2; pt requires assistance for weight shifting, L LE mobility, and facilitation at trunk and glutes for more upright posture; pt is  able to static stand without L LE buckling but requires knee blocked when attempting to step with R   Ambulation/Gait                 Stairs             Wheelchair Mobility    Modified Rankin (Stroke Patients Only)       Balance Overall balance assessment: Needs assistance Sitting-balance support: Feet supported Sitting balance-Leahy Scale: Fair Sitting balance - Comments: max cues for correcting to midline posture; able to briefly maintain static sitting with close minguard  Postural control: Left lateral lean Standing balance support: During functional activity;Bilateral upper extremity supported Standing balance-Leahy Scale: Poor                              Cognition Arousal/Alertness: Awake/alert Behavior During Therapy: Flat affect Overall Cognitive Status: Impaired/Different from baseline Area of Impairment: Following commands;Safety/judgement;Problem solving;Attention                   Current Attention Level: Focused   Following Commands: Follows one step commands with increased time;Follows one step commands inconsistently Safety/Judgement: Decreased awareness of deficits;Decreased awareness of safety   Problem Solving: Slow processing;Difficulty sequencing;Requires verbal cues General Comments: requires multimodal cues to follow commands      Exercises Other Exercises Other Exercises: gentle stretching to promote scapular retraction     General Comments        Pertinent Vitals/Pain Pain Assessment: Faces Faces Pain Scale: Hurts a  little bit Pain Location: neck Pain Descriptors / Indicators: Aching Pain Intervention(s): Monitored during session;Repositioned    Home Living                      Prior Function            PT Goals (current goals can now be found in the care plan section) Acute Rehab PT Goals Patient Stated Goal: to return to independence (per sister) Progress towards PT goals: Progressing  toward goals    Frequency    Min 4X/week      PT Plan Current plan remains appropriate    Co-evaluation PT/OT/SLP Co-Evaluation/Treatment: Yes Reason for Co-Treatment: Complexity of the patient's impairments (multi-system involvement);For patient/therapist safety;To address functional/ADL transfers PT goals addressed during session: Mobility/safety with mobility;Balance OT goals addressed during session: ADL's and self-care;Strengthening/ROM      AM-PAC PT "6 Clicks" Daily Activity  Outcome Measure  Difficulty turning over in bed (including adjusting bedclothes, sheets and blankets)?: Unable Difficulty moving from lying on back to sitting on the side of the bed? : Unable Difficulty sitting down on and standing up from a chair with arms (e.g., wheelchair, bedside commode, etc,.)?: Unable Help needed moving to and from a bed to chair (including a wheelchair)?: A Lot Help needed walking in hospital room?: Total Help needed climbing 3-5 steps with a railing? : Total 6 Click Score: 7    End of Session Equipment Utilized During Treatment: Gait belt Activity Tolerance: Patient tolerated treatment well Patient left: in chair;with call bell/phone within reach;with chair alarm set;with family/visitor present Nurse Communication: Mobility status PT Visit Diagnosis: Other abnormalities of gait and mobility (R26.89);Other symptoms and signs involving the nervous system (M35.361)     Time: 4431-5400 PT Time Calculation (min) (ACUTE ONLY): 39 min  Charges:  $Therapeutic Activity: 23-37 mins                     Erline Levine, PTA Pager: [phone]     Carolynne Edouard 04/27/2018, 11:26 AM

## 2018-04-27 NOTE — Clinical Social Work Placement (Signed)
   CLINICAL SOCIAL WORK PLACEMENT  NOTE  Date:  04/27/2018  Patient Details  Name: LEESA DELASHMIT MRN: [id number] Date of Birth: [birth year]  Clinical Social Work is seeking post-discharge placement for this patient at the Skilled  Nursing Facility level of care (*CSW will initial, date and re-position this form in  chart as items are completed):  Yes   Patient/family provided with [facility] Clinical Social Work Department's list of facilities offering this level of care within the geographic area requested by the patient (or if unable, by the patient's family).  Yes   Patient/family informed of their freedom to choose among providers that offer the needed level of care, that participate in Medicare, Medicaid or managed care program needed by the patient, have an available bed and are willing to accept the patient.  Yes   Patient/family informed of [facility]'s ownership interest in [facility] and [facility], as well as of the fact that they are under no obligation to receive care at these facilities.  PASRR submitted to EDS on 04/26/18     PASRR number received on 04/27/18     Existing PASRR number confirmed on       FL2 transmitted to all facilities in geographic area requested by pt/family on       FL2 transmitted to all facilities within larger geographic area on 04/26/18     Patient informed that his/her managed care company has contracts with or will negotiate with certain facilities, including the following:  [facility]     Yes   Patient/family informed of bed offers received.  Patient chooses bed at [facility]     Physician recommends and patient chooses bed at      Patient to be transferred to [facility] on 04/27/18.  Patient to be transferred to facility by PTAR     Patient family notified on 04/27/18 of transfer.  Name of family member notified:  Elease Hashimoto, daughter     PHYSICIAN Please prepare priority discharge summary, including medications,  Please prepare prescriptions, Please sign DNR     Additional Comment:    _______________________________________________ Abigail Butts, LCSW 04/27/2018, 3:59 PM

## 2018-04-27 NOTE — Progress Notes (Signed)
Paged around 1:30pm from nursing that patient's blood pressure was 103/51 and that she was difficult to arouse from sleep. Evaluated the patient at bedside. Her daughter was present and reported that the patient did not sleep much last night and they tried to keep her awake all day, so the patient was very tired tonight. The daughter did not believe there were any changes in the patient's neurological status. The patient herself reports that she feels tired, but overall feels improved since yesterday. She easily woke up to voice, answered questions appropriately, and followed commands. She had difficulty opening her eyes on exam, but the daughter states she often has trouble with this.  She continues to have left upper extremity weakness and mildly slurred speech. No new neurological deficits. Repeat manual blood pressure was 110/60. According to today's progress note from Dr. Josem Kaufmann, her goal blood pressure is 130-140 systolic. Will give 500cc NS fluid bolus.

## 2018-04-27 NOTE — Patient Outreach (Signed)
[facility]) Care Management  04/27/2018  Katelyn Mckinney 16-Nov-1925 [id number]   Initial outreach scheduled for today regarding social work referral.  Patient still hospitalized and discharge plan is currently for transition to SNF.  BSW will refer to CSW to follow if patient does in fact transition to SNF.   Malachy Chamber, BSW Social Worker [phone]

## 2018-04-27 NOTE — Progress Notes (Signed)
  Date: 04/27/2018  Patient name: Katelyn Mckinney  Medical record number: [id number]  Date of birth: September 03, 1925   I have seen and evaluated this patient and I have discussed the plan of care with the house staff. Please see their note for complete details. I concur with their findings with the following additions/corrections: [name] is stable for D/C. Await insurance approval for SNF. Clarify anti-plt with Dr Pearlean Brownie.  Burns Spain, MD 04/27/2018, 2:55 PM

## 2018-04-27 NOTE — Progress Notes (Signed)
Occupational Therapy Treatment Patient Details Name: Katelyn Mckinney MRN: [id number] DOB: 01-04-1926 Today's Date: 04/27/2018    History of present illness Pt is an 84 y/o female with a history of cerebrovascular accident of the right MCA territory last week, hypertension, and hyperlipidemia who presents after a fall with associated facial droop. Pt found to have an extension of a recent watershed CVA secondary to an M1 branch middle cerebral artery stenosis.   OT comments  Pt progressing towards OT goals, presents supine in bed pleasant and willing to participate in therapy session. Pt continues to demonstrate L inattention, decreased static and dynamic balance, and L side weakness. Pt requiring maxA for bed mobility, overall requiring minA for maintaining static sitting balance EOB but is able to briefly maintain with minguard assist given multimodal cues for maintaining midline posture. Pt performing sit<>stand with modA+2 during session and requiring maxA+2 for stand pivot transfer. Continues to require minguard-minA for simple grooming ADLs. Continue to recommend SNF level therapies at time of discharge. Will continue follow acutely to progress pt towards established OT goals.   Follow Up Recommendations  SNF;Supervision/Assistance - 24 hour    Equipment Recommendations  Other (comment)(TBD in next venue )          Precautions / Restrictions Precautions Precautions:  Fall Precaution Comments:  L inattention Restrictions Weight Bearing Restrictions:  No       Mobility Bed Mobility Overal bed mobility: Needs Assistance Bed Mobility: Supine to Sit     Supine to sit: Max assist;+2 for physical assistance;+2 for safety/equipment     General bed mobility comments: (P) pt OOB in chair upon arrival  Transfers Overall transfer level: Needs assistance Equipment used: Rolling walker (2 wheeled);2 person hand held assist Transfers:  Sit to/from Stand Sit to Stand: Max assist;+2  physical assistance;Mod assist Stand pivot transfers: Max assist;+2 physical assistance;+2 safety/equipment       General transfer comment:  pt stood with mod A +2 and requires max A +2 to maintain balance; pt assists with R side and required cues and assistance to protect L UE; multimodal cues for forward gaze, posture, and weight shifting for midline posture     Balance Overall balance assessment: Needs assistance Sitting-balance support: Feet supported Sitting balance-Leahy Scale: Fair Sitting balance - Comments: able to briefly maintain static sitting with close minguard  Postural control: Left lateral lean Standing balance support: During functional activity;Bilateral upper extremity supported Standing balance-Leahy Scale:  Poor                             ADL either performed or assessed with clinical judgement   ADL Overall ADL's : Needs assistance/impaired     Grooming: Min guard;Set up;Sitting Grooming Details (indicate cue type and reason): supported sitting in recliner                              Functional mobility during ADLs: Moderate assistance;Maximal assistance;+2 for physical assistance;+2 for safety/equipment General ADL Comments: pt continues with inattention to L side and L side weakness; has significant forward flexed posture; pt sat EOB >64min with focus on sitting balance and maintaining upright posture prior to transfer to recliner     Vision   Additional Comments: continues to demonstrate L side inattention    Perception     Praxis      Cognition Arousal/Alertness: (P) Awake/alert Behavior During Therapy: (P)  Flat affect Overall Cognitive Status: (P) Impaired/Different from baseline Area of Impairment: (P) Following commands;Safety/judgement;Problem solving;Attention                   Current Attention Level: (P) Focused   Following Commands: (P) Follows one step commands with increased time;Follows one step  commands inconsistently Safety/Judgement: (P) Decreased awareness of deficits;Decreased awareness of safety   Problem Solving: (P) Slow processing;Difficulty sequencing;Requires verbal cues General Comments: requires multimodal cues to follow commands        Exercises Other Exercises Other Exercises: P/AAROM to L elbow and digits  Other Exercises: gentle stretching to promote scapular retraction    Shoulder Instructions       General Comments      Pertinent Vitals/ Pain       Pain Assessment: (P) Faces  Home Living                                          Prior Functioning/Environment              Frequency  Min 2X/week        Progress Toward Goals  OT Goals(current goals can now be found in the care plan section)  Progress towards OT goals: Progressing toward goals  Acute Rehab OT Goals Patient Stated Goal: to return to independence (per sister) OT Goal Formulation: With patient/family Time For Goal Achievement: 05/07/18 Potential to Achieve Goals: Good  Plan Discharge plan remains appropriate    Co-evaluation    PT/OT/SLP Co-Evaluation/Treatment: Yes Reason for Co-Treatment: (P) Complexity of the patient's impairments (multi-system involvement);For patient/therapist safety;To address functional/ADL transfers PT goals addressed during session: (P) Mobility/safety with mobility;Balance OT goals addressed during session: (P) ADL's and self-care;Strengthening/ROM      AM-PAC PT "6 Clicks" Daily Activity     Outcome Measure   Help from another person eating meals?: A Lot Help from another person taking care of personal grooming?: A Lot   Help from another person bathing (including washing, rinsing, drying)?: A Lot Help from another person to put on and taking off regular upper body clothing?: A Lot Help from another person to put on and taking off regular lower body clothing?: A Lot 6 Click Score: 10    End of Session Equipment  Utilized During Treatment: Gait belt  OT Visit Diagnosis: Other abnormalities of gait and mobility (R26.89);Repeated falls (R29.6);Muscle weakness (generalized) (M62.81);Feeding difficulties (R63.3);Other symptoms and signs involving the nervous system (R29.898);Other symptoms and signs involving cognitive function;Hemiplegia and hemiparesis Hemiplegia - Right/Left: Left Hemiplegia - dominant/non-dominant: Non-Dominant Hemiplegia - caused by: Cerebral infarction   Activity Tolerance Patient tolerated treatment well   Patient Left in chair;with call bell/phone within reach;with family/visitor present;with chair alarm set   Nurse Communication Mobility status        Time: 6387-5643 OT Time Calculation (min): 39 min  Charges: OT General Charges $OT Visit: 1 Visit OT Treatments $Self Care/Home Management : 8-22 mins  Marcy Siren, OT Pager [phone] 04/27/2018   Orlando Penner 04/27/2018, 10:09 AM

## 2018-04-27 NOTE — Progress Notes (Signed)
[facility] here to pick up patient and take to [facility].  Vital signs are stable and patient states no pain; all questions r/t discharge have been answered.  All valuables have been returned to the patient, discharge packet handed to [facility], neither patient nor family have any additional questions at this time.

## 2018-04-27 NOTE — Discharge Instructions (Signed)

## 2018-04-27 NOTE — Discharge Summary (Signed)
Name: Katelyn Mckinney MRN: [id number] DOB: [birth year] 84 y.o. PCP: Aniceto Boss, MD  Date of Admission: 04/22/2018  9:52 AM Date of Discharge: 04/27/2018 Attending Physician: Dr. Rogelia Boga   Discharge Diagnosis: 1.  Right MCA infarction (Extension of a recent watershed CVA secondary to an M1 branch middle cerebral artery stenosis) 2.  Paroxysmal atrial fibrillation 3.  Hyperlipidemia 4.  Hypertension  Discharge Medications: Allergies as of 04/27/2018      Reactions   Latex Rash      Medication List    STOP taking these medications   aspirin 325 MG EC tablet   clopidogrel 75 MG tablet Commonly known as:  PLAVIX     TAKE these medications   acetaminophen 325 MG tablet Commonly known as:  TYLENOL Take 2 tablets (650 mg total) by mouth at bedtime.   apixaban 5 MG Tabs tablet Commonly known as:  ELIQUIS Take 1 tablet (5 mg total) by mouth 2 (two) times daily. Start taking on:  04/28/2018   ezetimibe 10 MG tablet Commonly known as:  ZETIA Take 1 tablet (10 mg total) by mouth daily.   LORazepam 0.5 MG tablet Commonly known as:  ATIVAN Take 0.5 mg by mouth at bedtime.   meclizine 25 MG tablet Commonly known as:  ANTIVERT Take 25 mg by mouth 3 (three) times daily as needed for dizziness.   metoprolol tartrate 25 MG tablet Commonly known as:  LOPRESSOR Take 1 tablet (25 mg total) by mouth 2 (two) times daily.   ranitidine 75 MG tablet Commonly known as:  ZANTAC Take 75 mg by mouth daily as needed for heartburn.   traMADol 50 MG tablet Commonly known as:  ULTRAM Take 50 mg by mouth every 6 (six) hours as needed for moderate pain.       Disposition and follow-up:   Katelyn.Mallery T Mckinney was discharged from [facility] in [place] condition.  At the hospital follow up visit please address:  1.  Right MCA Infarction: Maintain normal blood pressure. Encourage fluid intake      Atrial Fibrillation: Continue Metoprolol 12.5mg  twice daily and Eliquis 5mg   twice daily   2.  Labs / imaging needed at time of follow-up: None  3.  Pending labs/ test needing follow-up: None  Follow-up Appointments:   Hospital Course by problem list: 1.  Right MCA infarction (Extension of a recent watershed CVA secondary to an M1 branch middle cerebral artery stenosis): Katelyn Mckinney is an 85 year old woman with medical history significant for previous CVAs, hypertension, hyperlipidemia, newly diagnosed atrial fibrillation who presented to the emergency department on 04/22/2018 with slurred speech, facial droop and left-sided weakness.  On admission, CT head showed new/large infarctions in the posterior right temporal lobe and posterior right insula with no evidence of hemorrhage.  A code stroke was called but patient was not a TPA candidate given her recent history of CVA.  In addition, initial EKG showed atrial fibrillation with RVR at a rate of 133 bpm and received medical cardioversion with IV metoprolol 5 mg. CTA showed Right MCA M1 severe stenosis versus recent thrombo-embolic near occlusion without improvement from previous CTA on 04/18/18, Subacute Right MCA infarct affecting the posterior right temporal lobe and periatrial white matter, MRI brain showed Interval worsening in multiple right MCA territory infarcts, with multiple new/larger infarcts involving the right MCA distribution watershed in nature.  She was evaluated by neurology who recommended for patient to be started on Eliquis 5 mg twice a day on  hospital day 5 stroke given her new onset atrial fibrillation.  She was also evaluated by physical and Occupational Therapy who recommended SNF placement with 24-hour supervision and assistance.  Clinically, she shows signs of improvement on her neurological finding as her strength on the upper and lower extremity had mildly improved.  However she continued to have mild left-sided facial droop.  Per chart review, she was admitted prior on 04/18/2018 for right MCA territory  CVA when she presented with slurred speech, confusion and gait ataxia. Her CTA at that time showed right M1 segment near occulsion, and MRI with multiple punctate acute infarcts in deep brain and subcortical brain in the right basal ganglia, insular and subinsular region and right frontoparietal region, most consistent with watershed infarctions.  She was subsequently discharged with dual antiplatelet therapy and Zetia 10 mg and she has a statin intolerance.   2.  Paroxysmal atrial fibrillation: On presentation to the ED on 04/22/18, she was found to be in A-Fibb with RVR, rate of 133bmp. CHADs score was calculated to be 7, HAS-BLED score of 3 and putting her high risk of a thromboembolic event. However, she was not started on an anticoagulant immediately due to risk of hemorrhagic conversion. She was started on aspirin 325mg  and plavix 75mg  which was converted to Eliquis 5mg  twice daily on hospital day 5. For her presenting Atrial fibrillation, she received a one time dose of IV metoprolol 5mg  and converted to sinus rhythm. She was converted to oral metoprolol 25mg  twice daily on hospital day 3 for rate control.   3.  Hyperlipidemia: Her home Zetia 10mg  daily was continued  4.  Hypertension: She was kept normotensive during this admission and was maintained on metoprolol 25mg  twice daily. Her systolic blood pressure we low at times with range 90s-110s but overall had systolic blood pressures in the 130s and sometimes 140s. This was due to the thought that her worsening neurological function was due to cerebral hypo-perfusion from the severe Right M1 MCA stenosis  Discharge Vitals:   BP (!) 113/46 (BP Location: Left Arm)   Pulse 77   Temp (!) 97.5 F (36.4 C) (Oral)   Resp 18   Ht 5\' 5"  (1.651 m)   SpO2 100%   BMI 25.29 kg/m   Pertinent Labs, Studies, and Procedures:  CT Cerebral Perfusion   IMPRESSION: 1. Constellation of plain CT, CT Perfusion, and recent MRI findings consistent with a  Subacute Right MCA infarct affecting the posterior right temporal lobe and periatrial white matter. 2. Right MCA M1 severe stenosis versus recent thrombo-embolic near occlusion without improvement since the 04/18/2018 CTA, but no interval branch occlusion. 3. Subsequent prolonged perfusion time throughout the Right MCA territory, with the worst Tmax area corresponding to the subacute infarct. 4. Stable arterial findings on CTA head and neck elsewhere, no new stenosis, but other notable atherosclerotic stenosis: - supraclinoid Right ICA siphon, moderate. - Left CCA origin up to 60%. - Left ICA bulb up to 65%.  Discharge Instructions: Discharge Instructions    Call MD for:   Complete by:  As directed    Call MD for:  difficulty breathing, headache or visual disturbances   Complete by:  As directed    Call MD for:  extreme fatigue   Complete by:  As directed    Call MD for:  hives   Complete by:  As directed    Call MD for:  persistant dizziness or light-headedness   Complete by:  As directed  Call MD for:  persistant nausea and vomiting   Complete by:  As directed    Call MD for:  redness, tenderness, or signs of infection (pain, swelling, redness, odor or green/yellow discharge around incision site)   Complete by:  As directed    Call MD for:  severe uncontrolled pain   Complete by:  As directed    Call MD for:  temperature >100.4   Complete by:  As directed    Diet - low sodium heart healthy   Complete by:  As directed    Discharge instructions   Complete by:  As directed    Katelyn. Carrender, you were admitted to the hospital and treated because you had a stroke. Overall, you have improved since the day of your admission but not quite back to your baseline. Getting rehab and physical therapy to significantly help with your strength.   We have started you on a blood thinner called Eliquis which you'll start taking tomorrow (04/28/18) twice a day.   Please have close follow up with your  primary care physician after discharge.   It was a pleasure taking care of you   ~Dr. Dortha Schwalbe   Increase activity slowly   Complete by:  As directed       Signed: Yvette Rack, MD 04/27/2018, 1:58 PM   Pager: [phone] IMTS PGY-1

## 2018-04-27 NOTE — Progress Notes (Signed)
   Subjective:  Overnight: No acute events   Today, Ms Kaehler was examined at bedside with her family members present.  She was laying in the recliner.  She reports that she slept well yesterday than any other day.  She continues to endorse achiness at the back of her neck but had not received her Tylenol dose this morning.  Per family members, her p.o. intake has been gradually increasing.  Overall, the daughters report that she has had some improvement since admission but is now quite her baseline. Otherwise, she denies palpitation, chest pain, shortness of breath, headache or vision changes.  Objective:  Vital signs in last 24 hours: Vitals:   04/27/18 0106 04/27/18 0228 04/27/18 0230 04/27/18 0339  BP: (!) 103/51 (!) 106/57 110/60 (!) 121/58  Pulse: 75 88  66  Resp: 17   17  Temp:    98.1 F (36.7 C)  TempSrc:    Oral  SpO2: 94%   95%  Height:       Constitutional: In no apparent distress, very pleasant lady, very engaging and interactive. HEENT: Cervical spine and paraspinal nontender to palpation Cardiovascular: Normal S1 and S2.  No murmurs, gallops, rubs Respiratory: Clear to auscultation bilaterally.  No wheezes, crackles, rhonchi Abdomen: Bowel sounds present, soft, nondistended, nontender to palpation Neurological: (Unchanged from yesterday) -Alert and oriented to name only -Strength: 5/5 RUE, 4/5 LUE, 4/5 LLE, 5/5 RLE -Mild right sided facial droop  -Otherwise normal CN exams    Assessment/Plan:  Active Problems:   Stroke (HCC)   Paroxysmal atrial fibrillation (HCC)   Atrial fibrillation with RVR (HCC)   Non-rheumatic mitral valve stenosis  Ms. Wege is an 84 year old Caucasian female with medical history significant for previous CVAs, hypertension, hyperlipidemia, newly diagnosed paroxysmal atrial fibrillation currently being managed for right MCA stroke-HD 5 with noticeable moderate clinical improvement since admission.   Right M1 MCA infarction: She has had  some clinical improvement since admission as reiterated by her family members at bedside but not quite at her baseline which is expected.  I suspect she will improve gradually with rehabilitation.  Neurologically, her motor strength has been stable.  She still has weakness on the left upper and lower extremity and mild facial droop which has not worsened in the past 24 hours.  She has also had increased p.o. intake and is tolerating her pured diet.  Her daughter has agreed to SNF placement at [facility] and is currently awaiting insurance approval.  -Discontinue aspirin and Plavix and transition to Eliquis 5 mg twice daily starting 04/28/2018. -Continue Zetia 10 mg daily  Paroxysmal atrial fibrillation: Continues to remain in sinus rhythm. Plan is to continue telemetry monitoring until discharge, start Eliquis 5 mg twice daily and continue metoprolol 25 mg twice daily.  Hyperlipidemia: Continue Zetia 10 mg daily  Hypertension: BP normotensive this morning with SBP of 121.  This is ideal for her as would like to maintain normal BPs to increase cerebral perfusion.  Her home atenolol 100 mg daily will be discontinued at discharge.    Diet:Dysphagia 1 diet Code: DNR VTE Prophylaxis: lovenox  Dispo: Discharge to [facility] once insurance approval is achieved.  Yvette Rack, MD 04/27/2018, 6:06 AM Pager: [phone] IMTS PGY-1

## 2018-04-27 NOTE — Patient Outreach (Signed)
[facility]) Care Management  04/27/2018  Katelyn Mckinney 05-May-1926 [id number]  EMMI: stroke red alert Referral date: 04/27/18 Referral reason: feeling worse overall: yes, New problems walking / talking/ speaking/ seeing: YES Insurance: [employer] / [employer] shield Day # 6 Attempt #1  Telephone call to patient and daughter, Katelyn Mckinney.  Unable to reach. HIPAA compliant voice message left with call back phone number.   ASSESSMENT: Upon chart review patient scheduled for discharge today 04/27/18 / Hospitalized 04/22/18.    PLAN: RNCM will attempt 2nd telephone call to patient within 4 business days.   Katelyn Ina RN,BSN,CCM [facility] Telephonic  [phone]

## 2018-04-28 ENCOUNTER — Other Ambulatory Visit: Payer: Self-pay | Admitting: Licensed Clinical Social Worker

## 2018-04-28 ENCOUNTER — Other Ambulatory Visit: Payer: Self-pay

## 2018-04-28 NOTE — Patient Outreach (Signed)
  [facility]) Care Management  [facility] Social Work  04/28/2018  ZADIA UHDE 08-Jan-1926 [id number]  Subjective:    Objective:   Encounter Medications:  Outpatient Encounter Medications as of 04/28/2018  Medication Sig  . acetaminophen (TYLENOL) 325 MG tablet Take 2 tablets (650 mg total) by mouth at bedtime.  Marland Kitchen apixaban (ELIQUIS) 5 MG TABS tablet Take 1 tablet (5 mg total) by mouth 2 (two) times daily.  Marland Kitchen ezetimibe (ZETIA) 10 MG tablet Take 1 tablet (10 mg total) by mouth daily.  Marland Kitchen LORazepam (ATIVAN) 0.5 MG tablet Take 0.5 mg by mouth at bedtime.  . meclizine (ANTIVERT) 25 MG tablet Take 25 mg by mouth 3 (three) times daily as needed for dizziness.   . metoprolol tartrate (LOPRESSOR) 25 MG tablet Take 1 tablet (25 mg total) by mouth 2 (two) times daily.  . ranitidine (ZANTAC) 75 MG tablet Take 75 mg by mouth daily as needed for heartburn.  . traMADol (ULTRAM) 50 MG tablet Take 50 mg by mouth every 6 (six) hours as needed for moderate pain.   No facility-administered encounter medications on file as of 04/28/2018.     Functional Status:  In your present state of health, do you have any difficulty performing the following activities: 04/22/2018 04/18/2018  Hearing? Y N  Vision? N N  Difficulty concentrating or making decisions? Y N  Walking or climbing stairs? Y Y  Dressing or bathing? N N  Doing errands, shopping? Tempie Donning    Fall/Depression Screening:  No flowsheet data found.  Assessment:   CSW traveled to [facility] [place] on 04/28/18 to visit client.CSW met with client on 04/28/18 at client's room at nursing center. Client arrived at nursing home early on 04/28/18. She is hoping to get physical therapy sessions as scheduled.  She is receiving nursing care as scheduled. Client's daughter, Milus Glazier,  was present with client during [place] visit.Mardene Celeste is Hospital doctor for client. Client was hospitalized due to stroke. Client has  weakness on left side.  Client was sitting in wheelchair during visit. Client said she is eating adequately and hopes to sleep adequately.  Client wears glasses as needed to help with vision.  Mardene Celeste said she is hoping client can get needed therapy at facility. Mardene Celeste lives nearby , close to [employer]. Client had been living in [place] , [place].    CSW gave client and Mardene Celeste [facility] CSW card and encouraged client or Mardene Celeste to call CSW as needed to discuss social work needs of client.  Mardene Celeste feels that client may be at facility to receive care for a few weeks.  CSW encouraged Mardene Celeste or client to talk with facility social work to discuss client discharge plans.  CSW thanked client and Mardene Celeste for allowing CSW to visit with them at nursing facility on 04/28/18..   Plan:   CSW to call client or Mardene Celeste, daughter of client in 3 weeks to assess client needs.    Norva Riffle.Xaviera Flaten MSW, LCSW Licensed Clinical Social Worker [facility] Care Management [phone]

## 2018-04-28 NOTE — Patient Outreach (Signed)
[facility]) Care Management  04/28/2018  Katelyn Mckinney 1926-01-13 [id number]   [facility] BSW placing order for [facility] CSW to follow while at SNF.  BSW signing off.  Malachy Chamber, BSW Social Worker [phone]

## 2018-04-29 ENCOUNTER — Other Ambulatory Visit: Payer: Self-pay

## 2018-04-29 NOTE — Patient Outreach (Signed)
[facility]) Care Management  04/29/2018  SHALISSA WAHEED 07-21-1926 [id number]  Care coordination: Noted upon chart review patient is in [facility] rehab center.  Patient currently being followed by [facility] social worker, Lorna Few.  PLAN:  RNCM will request case manager assistant to discontinue EMMI stroke calls  No further follow up needed by this RNCM at this time.   George Ina RN,BSN,CCM [facility] Telephonic  [phone]

## 2018-05-06 ENCOUNTER — Ambulatory Visit: Payer: Self-pay

## 2018-05-12 ENCOUNTER — Other Ambulatory Visit: Payer: Self-pay | Admitting: Internal Medicine

## 2018-05-19 ENCOUNTER — Other Ambulatory Visit: Payer: Self-pay | Admitting: Licensed Clinical Social Worker

## 2018-05-19 NOTE — Patient Outreach (Signed)
Assessment:  CSW spoke via phone with Katelyn Mckinney, daughter of client. CSW verified identity of Katelyn Mckinney. CSW received verbal permission from Katelyn Mckinney for CSW to speak with Katelyn Mckinney about client needs and status. Client has been receiving nursing care and physical therapy support at [facility] in [place], [place]. Katelyn Mckinney reported to CSW that client had been receiving physical therapy sessions, as scheduled at facility, for 5 sessions weekly. She feels that these physical therapy sessions are helpful to client. Katelyn Mckinney said she had appealed a coverage decision for client. Katelyn Mckinney is waiting to hear response from her appeal for coverage for client at nursing facility. Client is sleeping adequately.  Client has had a stroke.  Client has support from her daughter, Katelyn Mckinney. Again, Katelyn Mckinney lives nearby [place]. Katelyn Mckinney is Engineer, agricultural for client. Katelyn Mckinney is hoping client can receive care a few more weeks at [facility] facility. CSW has given Katelyn Mckinney [facility] CSW phone number. CSW encouraged Katelyn Mckinney to call CSW at [phone] as needed to discuss social work needs of client.  CSW has also encouraged Katelyn Mckinney to speak with facility social worker to finalize client discharge plan.  CSW thanked Katelyn Mckinney for phone call with CSW on 05/19/18.  Plan:  CSW to call client or Katelyn Mckinney in 2 weeks to assess client status and needs at that time.   Katelyn Mckinney.Katelyn Mckinney MSW, LCSW Licensed Clinical Social Worker [facility] Care Management [phone]

## 2018-05-27 ENCOUNTER — Encounter: Payer: Self-pay | Admitting: Adult Health

## 2018-05-27 ENCOUNTER — Ambulatory Visit: Payer: Medicare Other | Admitting: Adult Health

## 2018-05-27 VITALS — BP 113/66 | HR 86 | Ht 63.0 in

## 2018-05-27 DIAGNOSIS — E785 Hyperlipidemia, unspecified: Secondary | ICD-10-CM

## 2018-05-27 DIAGNOSIS — I69319 Unspecified symptoms and signs involving cognitive functions following cerebral infarction: Secondary | ICD-10-CM

## 2018-05-27 DIAGNOSIS — I69391 Dysphagia following cerebral infarction: Secondary | ICD-10-CM

## 2018-05-27 DIAGNOSIS — I69354 Hemiplegia and hemiparesis following cerebral infarction affecting left non-dominant side: Secondary | ICD-10-CM

## 2018-05-27 DIAGNOSIS — I10 Essential (primary) hypertension: Secondary | ICD-10-CM

## 2018-05-27 DIAGNOSIS — I4891 Unspecified atrial fibrillation: Secondary | ICD-10-CM | POA: Diagnosis not present

## 2018-05-27 DIAGNOSIS — I63511 Cerebral infarction due to unspecified occlusion or stenosis of right middle cerebral artery: Secondary | ICD-10-CM

## 2018-05-27 NOTE — Patient Instructions (Signed)
Continue Eliquis (apixaban) daily  and Zetia 10mg   for secondary stroke prevention  Continue to follow up with PCP regarding cholesterol and blood pressure management   You will be called by cardiology to schedule appointment for atrial fibrillation management  Important to continue therapies at this time for continued therapy with left weakness, swallowing and cognitive concerns  Continue to monitor blood pressure at home  Maintain strict control of hypertension with blood pressure goal between 120-140, diabetes with hemoglobin A1c goal below 6.5% and cholesterol with LDL cholesterol (bad cholesterol) goal below 70 mg/dL. I also advised the patient to eat a healthy diet with plenty of whole grains, cereals, fruits and vegetables, exercise regularly and maintain ideal body weight.  Followup in the future with me in 3 months or call earlier if needed       Thank you for coming to see [place] at [facility]. I hope we have been able to provide you high quality care today.  You may receive a patient satisfaction survey over the next few weeks. We would appreciate your feedback and comments so that we may continue to improve ourselves and the health of our patients.

## 2018-05-27 NOTE — Progress Notes (Signed)
[name] [address] [place]. [place] [address] [phone]       OFFICE FOLLOW UP NOTE  [name] Date of Birth:  Dec 18, 1925 Medical Record Number:  [id number]   Reason for Referral:  hospital stroke follow up  CHIEF COMPLAINT:  Chief Complaint  Patient presents with  . Follow-up    Follow up hospital stroke follow she is in [facility] in wheelchair, pt is with Elease Hashimoto daughter and son in law [place]    HPI: Katelyn Mckinney is being seen today for initial visit in the office for multiple right MCA territory infarcts secondary to newly found AF on 04/22/2018. History obtained from patient, daughter and son-in-law and chart review. Reviewed all radiology images and labs personally.  [name] is a 84 y.o. female with history of   TIA, HTN and HLD who presented on 04/17/2018 with speech and gait difficulties.  CT head reviewed was negative for acute abnormality.  MRI head reviewed showed right MCA territory scattered infarcts.  CTA head and neck showed right M1 6 mm length segment near occlusion with severe threadlike stenosis along with aortic arthrosclerosis, right VA origin stenosis and left ICA 50% proximal stenosis.  2D echo showed an EF of 60 to 65%.  Bilateral lower extremity Dopplers were negative for DVT.  It was determined that stroke was related to right M1 high-grade stenosis versus potential cardioembolic source and recommended 30-day cardiac event monitor as outpatient to rule out A. fib.  LDL 164 and recommended to start Zetia 10 mg daily as patient is statin intolerant with possible consideration of PCSK9 inhibitor as outpatient.  HTN stable during admission and recommended long-term BP goal normotensive range.  Recommended DAPT with aspirin 325 and Plavix for 3 months then Plavix alone.  Incidental finding of 4 mm right upper lobe pulmonary nodule on CT and recommended outpatient follow-up.  Patient was discharged with  recommendations of home health PT/OT.  Patient return to ED on 04/22/2018 with slurred speech, facial droop and left sided weakness. CT head reviewed and showed new/larger infarcts in the posterior right temporal lobe and posterior right insula.  MRI head reviewed and showed interval worsening of multiple right MCA territory infarcts.  CTA head and neck showed right MCA M1 severe stenosis versus near occlusion.  Stent placement was declined by daughter during admission but per notes, possibly be open to discuss in the future. EKG during admission showed PAF.  Patient was previously on DAPT and recommended initiating Eliquis 5 mg twice daily 5 days post stroke on 04/27/2018 due to newly found atrial fibrillation.  As lipid panel performed at prior admission, was recommended to continue Zetia 10 mg daily along with consideration of PCSK9 inhibitors as outpatient as patient is statin intolerance.  Due to intracranial stenosis, is recommended to avoid hypotension with SBP goal 1 20-1 40.  Patient was discharged to [facility], [place] for continued therapies and 24-hour supervision.  Patient is being seen today for hospital follow-up and is accompanied by her daughter and son-in-law.  She continues to reside at [facility] and rehabilitation.  Daughter states that she is currently in an appeal process due to insurance companies attempting to discharge her from PT/OT/ST and potentially discharging her from current facility.  She continues to have left hemiparesis, left-sided neglect, cognitive concerns, dysphasia and intermittent expressive aphasia.  Daughter states she has seen some improvement but she is still unable to ambulate  and at times is able to stand pivot with a heavy to assist.  She continues to be on a pured diet due to swallowing issues and per daughter, swallowing exam was recently ordered but has not been scheduled at this time.  She needs mild to moderate  assistance with all ADLs.  She continues to take Eliquis without side effects of bleeding or bruising.  Is currently taking Zetia 10 mg daily without side effects noted.  Denies new or worsening stroke/TIA symptoms.   ROS:   14 system review of systems performed and negative with exception of trouble swallowing, feeling cold, joint pain, aching muscles, confusion, weakness and difficulty swallowing  PMH:  Past Medical History:  Diagnosis Date  . Atrial fibrillation (HCC) 04/22/2018  . CVA (cerebral vascular accident) (HCC)   . Diverticulitis   . GERD (gastroesophageal reflux disease)   . Hyperlipidemia   . Hypertension     PSH:  Past Surgical History:  Procedure Laterality Date  . CARPAL TUNNEL RELEASE    . CATARACT EXTRACTION, BILATERAL    . HEMORROIDECTOMY    . KNEE ARTHROPLASTY      Social History:  Social History   Socioeconomic History  . Marital status: Widowed    Spouse name: Not on file  . Number of children: Not on file  . Years of education: Not on file  . Highest education level: Not on file  Occupational History  . Not on file  Social Needs  . Financial resource strain: Not on file  . Food insecurity:    Worry: Not on file    Inability: Not on file  . Transportation needs:    Medical: Not on file    Non-medical: Not on file  Tobacco Use  . Smoking status: Former Games developer  . Smokeless tobacco: Never Used  . Tobacco comment: LIGHT SMOKER OVER 30 YEARS AGO  Substance and Sexual Activity  . Alcohol use: Never    Frequency: Never  . Drug use: Never  . Sexual activity: Not on file  Lifestyle  . Physical activity:    Days per week: Not on file    Minutes per session: Not on file  . Stress: Not on file  Relationships  . Social connections:    Talks on phone: Not on file    Gets together: Not on file    Attends religious service: Not on file    Active member of club or organization: Not on file    Attends meetings of clubs or organizations: Not on  file    Relationship status: Not on file  . Intimate partner violence:    Fear of current or ex partner: Not on file    Emotionally abused: Not on file    Physically abused: Not on file    Forced sexual activity: Not on file  Other Topics Concern  . Not on file  Social History Narrative  . Not on file    Family History: History reviewed. No pertinent family history.  Medications:   Current Outpatient Medications on File Prior to Visit  Medication Sig Dispense Refill  . acetaminophen (TYLENOL) 325 MG tablet Take 2 tablets (650 mg total) by mouth at bedtime. 30 tablet 0  . apixaban (ELIQUIS) 5 MG TABS tablet Take 1 tablet (5 mg total) by mouth 2 (two) times daily. 60 tablet 3  . ezetimibe (ZETIA) 10 MG tablet Take 1 tablet (10 mg total) by mouth daily. 30 tablet 0  . LORazepam (ATIVAN) 0.5 MG  tablet Take 0.5 mg by mouth at bedtime.  2  . meclizine (ANTIVERT) 25 MG tablet Take 25 mg by mouth 3 (three) times daily as needed for dizziness.   1  . metoprolol tartrate (LOPRESSOR) 25 MG tablet Take 1 tablet (25 mg total) by mouth 2 (two) times daily. 60 tablet 3  . ranitidine (ZANTAC) 75 MG tablet Take 75 mg by mouth daily as needed for heartburn.    . traMADol (ULTRAM) 50 MG tablet Take 50 mg by mouth every 6 (six) hours as needed for moderate pain.     No current facility-administered medications on file prior to visit.     Allergies:   Allergies  Allergen Reactions  . Latex Rash     Physical Exam  Vitals:   05/27/18 1502  BP: 113/66  Pulse: 86  Height: 5\' 3"  (1.6 m)   Body mass index is 27.34 kg/m. No exam data present  General: well developed, well nourished, pleasant elderly Caucasian female, seated, in no evident distress Head: head normocephalic and atraumatic.   Neck: supple with no carotid or supraclavicular bruits Cardiovascular: regular rate and rhythm, no murmurs Musculoskeletal: no deformity Skin:  no rash/petichiae Vascular:  Normal pulses all  extremities  Neurologic Exam Mental Status: Awake and fully alert.  Unable to adequately assess orientation level due to possible interference with expressive aphasia.  Attention span, concentration and fund of knowledge appropriate. Mood and affect appropriate and cooperative throughout exam.  Cranial Nerves: Fundoscopic exam reveals sharp disc margins. Pupils equal, briskly reactive to light. Extraocular movements full without nystagmus. Visual fields full to confrontation. Hearing intact. Facial sensation intact.  Left facial paralysis noted.  Motor: Normal bulk and tone.  LUE: 2/5 deltoid, 0/5 tricep, bicep and grip strength; LLE: 2/5 hip flexor, 3/5 quad extension and flexion, 0/5 ankle dorsiflexion Sensory.:  Decreased sensation left upper and lower extremity Coordination: Rapid alternating movements normal in all extremities. Finger-to-nose and heel-to-shin performed accurately bilaterally. Gait and Station: Patient is currently sitting in wheelchair and is currently unable to ambulate therefore gait not assessed Reflexes: 3+ left upper and lower extremities 1+ right upper and lower extremities toes downgoing.    NIHSS  10 Modified Rankin  4 HAS-BLED 2 CHA2DS2-VASc 6   Diagnostic Data (Labs, Imaging, Testing)  Ct Head Wo Contrast 04/18/2018 IMPRESSION:  1. No acute intracranial abnormality identified.  2. Mild chronic microvascular ischemic changes and volume loss of the brain for age. Small chronic infarct within the left anterior thalamus.   MR BRAIN without CONTRAST 04/18/2018 IMPRESSION: Multiple punctate acute infarctions in the deep brain and subcortical brain in the right basal ganglia, insular and subinsular region and right frontoparietal region, most consistent with watershed infarctions, given the relative absence of cortical infarction. No evidence of hemorrhage or swelling.  Ct Angio Head W Or Wo Contrast Ct Angio Neck W And/or Wo Contrast 04/18/2018 IMPRESSION:  1.  Right M1 6 mm length segment of near occlusion, severe thread-like stenosis. Diminished downstream right MCA distribution in comparison with the left. CT perfusion of the head may be useful to assess for oligemia.  2. Calcific atherosclerosis of the aorta, carotid arteries, and vertebral arteries. Multiple segments of mild stenosis in the anterior and posterior circulations.  3. No additional large vessel occlusion, hemodynamically significant stenosis by NASCET criteria, dissection, or vascular malformation.  4. 4 mm right upper lobe pulmonary nodule. No follow-up needed if patient is low-risk. Non-contrast chest CT can be considered in 12 months if patient  is high-risk. This recommendation follows the consensus statement: Guidelines for Management of Incidental Pulmonary Nodules Detected on CT Images: From the Fleischner Society 2017; Radiology 2017; 284:228-243.  5. Trace bilateral pleural effusions and mild interstitial pulmonary edema.   Ct Angio Head W Or Wo Contrast Ct Angio Neck W Or Wo Contrast Ct Cerebral Perfusion W Contrast 04/22/2018 IMPRESSION:  1. Constellation of plain CT, CT Perfusion, and recent MRI findings consistent with a Subacute Right MCA infarct affecting the posterior right temporal lobe and periatrial white matter.  2. Right MCA M1 severe stenosis versus recent thrombo-embolic near occlusion without improvement since the 04/18/2018 CTA, but no interval branch occlusion.  3. Subsequent prolonged perfusion time throughout the Right MCA territory, with the worst Tmax area corresponding to the subacute infarct.  4. Stable arterial findings on CTA head and neck elsewhere, no new stenosis, but other notable atherosclerotic stenosis: - supraclinoid Right ICA siphon, moderate. - Left CCA origin up to 60%. - Left ICA bulb up to 65%.   Mr Brain Wo Contrast 04/22/2018 IMPRESSION:  1. Interval worsening in multiple right MCA territory infarcts, with multiple new/larger infarcts  involving the right MCA distribution as above. Infarcts primarily watershed in nature. No associated hemorrhage.  2. Otherwise stable appearance of the brain. No other acute intracranial abnormality identified.   Ct Head Code Stroke Wo Contrast 04/22/2018 IMPRESSION:  1. New/larger infarcts in the posterior right temporal lobe and posterior right insula. No hemorrhage.  2. ASPECTS is 8.  3. Evolving early subacute infarcts in the right basal ganglia and white matter as shown on recent prior MRI.   Transthoracic Echocardiogram  04/18/2018 Study Conclusions - Left ventricle: The cavity size was normal. Wall thickness was normal. Systolic function was normal. The estimated ejection fraction was in the range of 60% to 65%. The study is not technically sufficient to allow evaluation of LV diastolic function. - Aortic valve: Sclerosis without stenosis. - Mitral valve: Severely calcified annulus. Moderately thickened, moderately calcified leaflets . The findings are consistent with mild stenosis. There was mild regurgitation. Valve area by continuity equation (using LVOT flow): 1 cm^2. - Left atrium: The atrium was moderately to severely dilated. - Atrial septum: No defect or patent foramen ovale was identified. - Pulmonary arteries: PA peak pressure: 43 mm Hg (S).    ASSESSMENT: Katelyn Mckinney is a 84 y.o. year old female here with multiple right MCA territory infarcts on 04/17/2018 secondary to intracranial stenosis versus cardioembolic source.  Patient return to ED on 04/22/2018 with new/larger right MCA territory infarcts secondary to severe right MCA stenosis in the setting of orthostatic hypotension along with newly diagnosed atrial fibrillation and was started on Eliquis. Vascular risk factors include HTN, HLD, intracranial stenosis and newly diagnosed AF.  Patient is being seen today for hospital follow-up and continues to have deficits of left hemiparesis, left-sided  neglect, dysphasia, expressive aphasia, and cognitive decline.    PLAN: -Continue Eliquis (apixaban) daily  and Zetia 10 mg for secondary stroke prevention -F/u with PCP regarding your HLD and HTN management -request PCP to check lipid panel in 1 to 2 months and if continues to be elevated, recommend consideration of PCSK9 inhibitor.  Also recommend PCP to follow-up on pulmonary nodule with repeat imaging if needed in 1 years time. -Referral placed for cardiology establishment for atrial fibrillation and Eliquis management -Highly recommend continuation of PT/OT/ST for continued deficits as there is still potential for improvement as stroke was approximately 4 weeks ago -continue to  monitor BP at facility -advised to continue to stay active and maintain a healthy diet -Maintain strict control of hypertension with blood pressure goal between 1 20-1 40, diabetes with hemoglobin A1c goal below 6.5% and cholesterol with LDL cholesterol (bad cholesterol) goal below 70 mg/dL. I also advised the patient to eat a healthy diet with plenty of whole grains, cereals, fruits and vegetables, exercise regularly and maintain ideal body weight.  Follow up in 3 months or call earlier if needed   Greater than 50% of time during this 25 minute visit was spent on counseling,explanation of diagnosis of multiple right MCA territory infarcts, reviewing risk factor management of HLD, HTN, intracranial stenosis and newly diagnosed AF, planning of further management, discussion with patient and family and coordination of care    George Hugh, AGNP-BC  [facility] [address] [place], [place] [address]  Phone [phone] Fax [phone] Note: This document was prepared with digital dictation and possible smart phrase technology. Any transcriptional errors that result from this process are unintentional.

## 2018-05-28 NOTE — Progress Notes (Signed)
I agree with the above plan 

## 2018-06-03 ENCOUNTER — Other Ambulatory Visit: Payer: Self-pay | Admitting: Licensed Clinical Social Worker

## 2018-06-03 NOTE — Patient Outreach (Signed)
Assessment:  CSW spoke via phone with Katelyn Mckinney.  CSW verified identity of Katelyn Mckinney. CSW received verbal permission from Katelyn Mckinney for CSW to speak with Katelyn Mckinney about client needs.Client has been receiving nursing care and physical therapy support at [facility] in [place], [place]. Katelyn Mckinney had reported to CSW that client has been receiving physical therapy sessions at facility for 5 sessions weekly.  Katelyn Mckinney said that these physical therapy sessions for client are very helpful to client. Katelyn Mckinney , daughter of client, is Engineer, agricultural for client. Katelyn Mckinney has said that she lives in close proximity to [name]. CSW has encouraged Katelyn Mckinney to speak with facility social worker to finalize client discharge plans.  Katelyn Mckinney said she is currently in an appeal process with client insurance company.  She is waiting to hear from client insurance company regarding appeal for client care.  CSW has also talked with Katelyn Mckinney and client about [name] program support in nursing, social work and pharmacy.CSW thanked Katelyn Mckinney for phone call with CSW on 06/03/18.  CSW has given Katelyn Mckinney [name] CSW card. CSW encouraged Katelyn Mckinney to call CSW as needed to discuss social work needs of client.   Plan:  CSW to call client or Katelyn Mckinney in 3 weeks to assess client needs and discharge status at that time   Kelton Pillar.[employer] Schwanke MSW, LCSW Licensed Clinical Social Worker [facility] Care Management [phone]

## 2018-06-23 ENCOUNTER — Other Ambulatory Visit: Payer: Self-pay | Admitting: Gastroenterology

## 2018-06-23 DIAGNOSIS — R1314 Dysphagia, pharyngoesophageal phase: Secondary | ICD-10-CM

## 2018-06-24 ENCOUNTER — Other Ambulatory Visit: Payer: Self-pay | Admitting: Licensed Clinical Social Worker

## 2018-06-24 NOTE — Patient Outreach (Signed)
Assessment:  Client has been receiving nursing care and physical therapy support at [facility] in [place], [place].  Client has support of her daughter, Katelyn Mckinney. CSW spoke via phone with Katelyn Mckinney, daughter of client, on 06/24/18. CSW verified identity of Katelyn Mckinney.  CSW received verbal permission from Katelyn Mckinney for CSW to speak with her about needs and status of client.  Katelyn Mckinney had previously reported to CSW that client had been receiving physical therapy sessions 5 times weekly at nursing center.  Katelyn Mckinney thought that these physical therapy sessions have been helpful to client. Katelyn Mckinney is  Durable Power of Attorney for client.  Katelyn Mckinney does live in close proximity to [employer] facility.  Katelyn Mckinney said previously that she had appealed insurance decision regarding client care costs. CSW reminded Katelyn Mckinney of her communicating with facility social worker, as needed, to finalize client discharge plans and to discuss client needs. Client has had a stroke with left hemiparesis. Thus, Katelyn Mckinney is hoping client can receive additional physical therapy support at facility to help client. Katelyn Mckinney has [facility] CSW card and CSW has encouraged Katelyn Mckinney to call CSW as needed to discuss social work needs of client.  Katelyn Mckinney said client is now receiving reduced number of therapy sessions each week;however, client is still receiving some physical therapy sessions, some occupational therapy sessions and some speech therapy sessions each week (per Katelyn Mckinney).  CSW thanked Katelyn Mckinney for phone call with CSW on 06/24/18.   Plan:  CSW to call client or Katelyn Mckinney, daughter of client, in 4 weeks to assess client needs at that time.  Kelton Pillar.Corbett Moulder MSW, LCSW Licensed Clinical Social Worker [facility] Care Management [phone]

## 2018-06-25 ENCOUNTER — Ambulatory Visit
Admission: RE | Admit: 2018-06-25 | Discharge: 2018-06-25 | Disposition: A | Discharge status: Home / Self Care | Payer: Medicare Other | Source: Ambulatory Visit | Attending: Gastroenterology | Admitting: Gastroenterology

## 2018-06-25 DIAGNOSIS — R1314 Dysphagia, pharyngoesophageal phase: Secondary | ICD-10-CM

## 2018-07-07 ENCOUNTER — Ambulatory Visit: Payer: Medicare Other | Admitting: Internal Medicine

## 2018-07-08 ENCOUNTER — Other Ambulatory Visit: Payer: Self-pay | Admitting: Licensed Clinical Social Worker

## 2018-07-08 NOTE — Patient Outreach (Signed)
Assessment:  CSW consulted with Livia SnellenGeronda Pulliam regarding client needs and status. Livia SnellenGeronda Pulliam encouraged CSW to discharge client from [facility] CSW services since client had been receiving care for several months at [facility] in [place], [place].  Client's current medical needs are being addressed at nursing facility.  Thus, CSW is discharging Katelyn [facility]ck[facility] from [facility] services on 07/08/18.   Kelton PillarMichael S.Priyal Musquiz MSW, LCSW Licensed Clinical Social Worker [facility] Care Management [phone]

## 2018-07-16 ENCOUNTER — Ambulatory Visit: Payer: Medicare Other | Admitting: Internal Medicine

## 2018-07-16 ENCOUNTER — Encounter: Payer: Self-pay | Admitting: Internal Medicine

## 2018-07-16 VITALS — BP 99/49 | HR 52 | Wt 137.8 lb

## 2018-07-16 DIAGNOSIS — I480 Paroxysmal atrial fibrillation: Secondary | ICD-10-CM | POA: Diagnosis not present

## 2018-07-16 MED ORDER — METOPROLOL TARTRATE 25 MG PO TABS: 12.5000 mg | ORAL_TABLET | Freq: Two times a day (BID) | ORAL | Status: AC

## 2018-07-16 NOTE — Progress Notes (Signed)
Cardiology Office Note:    Date:  08/01/2018   ID:  [name], DOB [birth year], MRN [id number]  PCP:  Aniceto Boss, MD  Cardiologist:  No primary care provider on file.  Electrophysiologist:  None   Referring MD: George Hugh, NP   Atrial fibrillation and stroke  History of Present Illness:    Katelyn Mckinney is a 84 y.o. female with a hx of stroke with significant residual defect and memory deficit after stroke, first on August 24 then on August 28.  She had a right MCA infarct.  And was found to be in atrial fibrillation.  She presents for cardiology follow-up, however fortunately she is in sinus rhythm.  She is anticoagulated with apixaban 5 mg twice daily.  In speaking to her daughter the primary issues include increased level of care necessary and trouble with her memory.  She slumps in her wheelchair, and her daughter is concerned that she has musculoskeletal back pain as a result.  Past Medical History:  Diagnosis Date  . Atrial fibrillation (HCC) 04/22/2018  . CVA (cerebral vascular accident) (HCC)   . Diverticulitis   . GERD (gastroesophageal reflux disease)   . Hyperlipidemia   . Hypertension     Past Surgical History:  Procedure Laterality Date  . CARPAL TUNNEL RELEASE    . CATARACT EXTRACTION, BILATERAL    . HEMORROIDECTOMY    . KNEE ARTHROPLASTY      Current Medications: Current Meds  Medication Sig  . acetaminophen (TYLENOL) 325 MG tablet Take 2 tablets (650 mg total) by mouth at bedtime.  Marland Kitchen apixaban (ELIQUIS) 5 MG TABS tablet Take 1 tablet (5 mg total) by mouth 2 (two) times daily.  Marland Kitchen dextromethorphan-guaiFENesin (ROBITUSSIN-DM) 10-100 MG/5ML liquid Take 20 mLs by mouth every 6 (six) hours as needed for cough.  . docusate (COLACE) 50 MG/5ML liquid Take by mouth 2 (two) times daily.  Marland Kitchen ezetimibe (ZETIA) 10 MG tablet Take 1 tablet (10 mg total) by mouth daily.  . hydrOXYzine (ATARAX/VISTARIL) 25 MG tablet Take 25 mg by mouth every 8 (eight)  hours as needed for anxiety or itching.  Marland Kitchen LORazepam (ATIVAN) 0.5 MG tablet Take 0.5 mg by mouth at bedtime.  . meclizine (ANTIVERT) 25 MG tablet Take 25 mg by mouth 3 (three) times daily as needed for dizziness.   . metoprolol tartrate (LOPRESSOR) 25 MG tablet Take 0.5 tablets (12.5 mg total) by mouth 2 (two) times daily.  Marland Kitchen omeprazole (PRILOSEC) 40 MG capsule Take 40 mg by mouth daily.  . polyethylene glycol (MIRALAX / GLYCOLAX) packet Take 17 g by mouth every other day.  . traMADol (ULTRAM) 50 MG tablet Take 50 mg by mouth every 6 (six) hours as needed for moderate pain.  . traZODone (DESYREL) 50 MG tablet Take 12.5 mg by mouth every morning. Take 1/4 (12.5 mg) tablet by mouth in mornings for agitation  . [DISCONTINUED] metoprolol tartrate (LOPRESSOR) 25 MG tablet Take 1 tablet (25 mg total) by mouth 2 (two) times daily.     Allergies:   Latex   Social History   Socioeconomic History  . Marital status: Widowed    Spouse name: Not on file  . Number of children: Not on file  . Years of education: Not on file  . Highest education level: Not on file  Occupational History  . Not on file  Social Needs  . Financial resource strain: Not on file  . Food insecurity:    Worry: Not on file  Inability: Not on file  . Transportation needs:    Medical: Not on file    Non-medical: Not on file  Tobacco Use  . Smoking status: Former Games developermoker  . Smokeless tobacco: Never Used  . Tobacco comment: LIGHT SMOKER OVER 30 YEARS AGO  Substance and Sexual Activity  . Alcohol use: Never    Frequency: Never  . Drug use: Never  . Sexual activity: Not on file  Lifestyle  . Physical activity:    Days per week: Not on file    Minutes per session: Not on file  . Stress: Not on file  Relationships  . Social connections:    Talks on phone: Not on file    Gets together: Not on file    Attends religious service: Not on file    Active member of club or organization: Not on file    Attends meetings of  clubs or organizations: Not on file    Relationship status: Not on file  Other Topics Concern  . Not on file  Social History Narrative  . Not on file     Family History: The patient's family history significant for mother with cancer, high blood pressure, heart attack, and father with heart disease.  No significant family history of stroke ROS:   Please see the history of present illness.    All other systems reviewed and are negative.  EKGs/Labs/Other Studies Reviewed:    The following studies were reviewed today:  EKG:  EKG is ordered today.  The ekg ordered today demonstrates sinus bradycardia ventricular rate 52.  Recent Labs: 04/22/2018: ALT 23; BUN 16; Creatinine, Ser 0.90; Hemoglobin 14.3; Platelets 199; Potassium 3.8; Sodium 140 04/24/2018: TSH 6.084  Recent Lipid Panel    Component Value Date/Time   CHOL 244 (H) 04/18/2018 0522   TRIG 161 (H) 04/18/2018 0522   HDL 48 04/18/2018 0522   CHOLHDL 5.1 04/18/2018 0522   VLDL 32 04/18/2018 0522   LDLCALC 164 (H) 04/18/2018 0522    Physical Exam:    VS:  BP (!) 99/49 (BP Location: Right Arm, Patient Position: Sitting, Cuff Size: Large)   Pulse (!) 52   Wt 137 lb 12.8 oz (62.5 kg)   BMI 24.41 kg/m     Wt Readings from Last 3 Encounters:  07/16/18 137 lb 12.8 oz (62.5 kg)  04/27/18 154 lb 5.2 oz (70 kg)  04/17/18 152 lb (68.9 kg)     Constitutional: No acute distress ENMT: moist mucous membranes Cardiovascular: regular rhythm, bradycardic, no murmurs. S1 and S2 normal. Respiratory: clear to auscultation bilaterally GI : normal bowel sounds, soft and nontender. No distention.   MSK: extremities warm, well perfused.  Bilateral mild edema.  NEURO: Left-sided weakness. PSYCH: Mildly confused about nature of visit.  ASSESSMENT:    1. Paroxysmal atrial fibrillation (HCC)    PLAN:    1. Paroxysmal atrial fibrillation (HCC)    The patient was not able to participate fully in the decision-making portion of the  conversation, however was involved in all parts of the discussion.  The patient's daughter and I participated in shared decision making.  We discussed follow-up appointments, and her daughter said that it would be quite difficult to get her back here for follow-up unless it was absolutely necessary.  She is currently in sinus rhythm, and continues to try to increase her strength, however it does not sound like she has significant PT/OT involvement in her recovery currently, per her daughter.  This would be critical  for her continued recovery.  I have broached the subject of goals of care and symptom management, and the patient's daughter will consider this.  Fortunately she is in sinus rhythm currently and has had no cardiopulmonary symptoms relating to uncontrolled rates in atrial fibrillation.  I recommend that the patient come back for follow-up as needed and I would be happy to see her at any time.  Medication Adjustments/Labs and Tests Ordered: Current medicines are reviewed at length with the patient today.  Concerns regarding medicines are outlined above.  Orders Placed This Encounter  Procedures  . EKG 12-Lead   Meds ordered this encounter  Medications  . metoprolol tartrate (LOPRESSOR) 25 MG tablet    Sig: Take 0.5 tablets (12.5 mg total) by mouth 2 (two) times daily.    Patient Instructions  Medication Instructions:  Your physician has recommended you make the following change in your medication:  REDUCE Metoprolol to 12.5mg  twice daily  If you need a refill on your cardiac medications before your next appointment, please call your pharmacy.   Lab work: None ordered If you have labs (blood work) drawn today and your tests are completely normal, you will receive your results only by: Marland Kitchen MyChart Message (if you have MyChart) OR . A paper copy in the mail If you have any lab test that is abnormal or we need to change your treatment, we will call you to review the  results.  Testing/Procedures: None ordered  Follow-Up: At [facility], you and your health needs are our priority.  As part of our continuing mission to provide you with exceptional heart care, we have created designated Provider Care Teams.  These Care Teams include your primary Cardiologist (physician) and Advanced Practice Providers (APPs -  Physician Assistants and Nurse Practitioners) who all work together to provide you with the care you need, when you need it. You will need a follow up appointment in 6 months.  Please call our office 2 months in advance to schedule this appointment.  You may see  Dr.Geral Tuch or one of the following Advanced Practice Providers on your designated Care Team:   Theodore Demark, PA-C . Joni Reining, DNP, ANP  Any Other Special Instructions Will Be Listed Below (If Applicable).       Signed, Parke Poisson, MD  08/01/2018 10:04 PM    [facility]

## 2018-07-16 NOTE — Patient Instructions (Addendum)
Medication Instructions:  Your physician has recommended you make the following change in your medication:  REDUCE Metoprolol to 12.5mg  twice daily  If you need a refill on your cardiac medications before your next appointment, please call your pharmacy.   Lab work: None ordered If you have labs (blood work) drawn today and your tests are completely normal, you will receive your results only by: Marland Kitchen. MyChart Message (if you have MyChart) OR . A paper copy in the mail If you have any lab test that is abnormal or we need to change your treatment, we will call you to review the results.  Testing/Procedures: None ordered  Follow-Up: At [facility], you and your health needs are our priority.  As part of our continuing mission to provide you with exceptional heart care, we have created designated Provider Care Teams.  These Care Teams include your primary Cardiologist (physician) and Advanced Practice Providers (APPs -  Physician Assistants and Nurse Practitioners) who all work together to provide you with the care you need, when you need it. You will need a follow up appointment in 6 months.  Please call our office 2 months in advance to schedule this appointment.  You may see  Dr.Acharya or one of the following Advanced Practice Providers on your designated Care Team:   Theodore DemarkRhonda Barrett, PA-C . Joni ReiningKathryn Lawrence, DNP, ANP  Any Other Special Instructions Will Be Listed Below (If Applicable).

## 2018-07-26 ENCOUNTER — Ambulatory Visit: Payer: Self-pay | Admitting: Licensed Clinical Social Worker

## 2018-09-01 ENCOUNTER — Ambulatory Visit: Payer: Medicare Other | Admitting: Adult Health

## 2018-09-01 ENCOUNTER — Encounter: Payer: Self-pay | Admitting: Adult Health

## 2018-09-01 VITALS — BP 128/70 | HR 65 | Ht 63.0 in

## 2018-09-01 DIAGNOSIS — F01518 Vascular dementia, unspecified severity, with other behavioral disturbance: Secondary | ICD-10-CM

## 2018-09-01 DIAGNOSIS — I63511 Cerebral infarction due to unspecified occlusion or stenosis of right middle cerebral artery: Secondary | ICD-10-CM | POA: Diagnosis not present

## 2018-09-01 DIAGNOSIS — I480 Paroxysmal atrial fibrillation: Secondary | ICD-10-CM | POA: Diagnosis not present

## 2018-09-01 DIAGNOSIS — F0151 Vascular dementia with behavioral disturbance: Secondary | ICD-10-CM

## 2018-09-01 DIAGNOSIS — I10 Essential (primary) hypertension: Secondary | ICD-10-CM

## 2018-09-01 MED ORDER — DIVALPROEX SODIUM 125 MG PO DR TAB: 125.0000 mg | DELAYED_RELEASE_TABLET | Freq: Every day | ORAL | 3 refills | Status: AC

## 2018-09-01 NOTE — Progress Notes (Signed)
[name] [address] [place]. [place] [address] [phone]       OFFICE FOLLOW UP NOTE  [name] Date of Birth:  [birth year] Medical Record Number:  [id number]   Reason for Referral:  hospital stroke follow up  CHIEF COMPLAINT:  Chief Complaint  Patient presents with  . Follow-up    Follow up appt for Stroke room in back hallway pt with daughter Katelyn Mckinney and son in law leonard pt at [facility] facility pt in wheellchair     HPI: Katelyn Mckinney is being seen today for initial visit in the office for multiple right MCA territory infarcts secondary to newly found AF on 04/22/2018. History obtained from patient, daughter and son-in-law and chart review. Reviewed all radiology images and labs personally.  Ms. [name] is a 85 y.o. female with history of   TIA, HTN and HLD who presented on 04/17/2018 with speech and gait difficulties.  CT head reviewed was negative for acute abnormality.  MRI head reviewed showed right MCA territory scattered infarcts.  CTA head and neck showed right M1 6 mm length segment near occlusion with severe threadlike stenosis along with aortic arthrosclerosis, right VA origin stenosis and left ICA 50% proximal stenosis.  2D echo showed an EF of 60 to 65%.  Bilateral lower extremity Dopplers were negative for DVT.  It was determined that stroke was related to right M1 high-grade stenosis versus potential cardioembolic source and recommended 30-day cardiac event monitor as outpatient to rule out A. fib.  LDL 164 and recommended to start Zetia 10 mg daily as patient is statin intolerant with possible consideration of PCSK9 inhibitor as outpatient.  HTN stable during admission and recommended long-term BP goal normotensive range.  Recommended DAPT with aspirin 325 and Plavix for 3 months then Plavix alone.  Incidental finding of 4 mm right upper lobe pulmonary nodule on CT and recommended outpatient follow-up.  Patient was  discharged with recommendations of home health PT/OT.  Patient return to ED on 04/22/2018 with slurred speech, facial droop and left sided weakness. CT head reviewed and showed new/larger infarcts in the posterior right temporal lobe and posterior right insula.  MRI head reviewed and showed interval worsening of multiple right MCA territory infarcts.  CTA head and neck showed right MCA M1 severe stenosis versus near occlusion.  Stent placement was declined by daughter during admission but per notes, possibly be open to discuss in the future. EKG during admission showed PAF.  Patient was previously on DAPT and recommended initiating Eliquis 5 mg twice daily 5 days post stroke on 04/27/2018 due to newly found atrial fibrillation.  As lipid panel performed at prior admission, was recommended to continue Zetia 10 mg daily along with consideration of PCSK9 inhibitors as outpatient as patient is statin intolerance.  Due to intracranial stenosis, is recommended to avoid hypotension with SBP goal 1 20-1 40.  Patient was discharged to [facility], [place] for continued therapies and 24-hour supervision.  Patient is being seen today for hospital follow-up and is accompanied by her daughter and son-in-law.  She continues to reside at [facility] and rehabilitation.  Daughter states that she is currently in an appeal process due to insurance companies attempting to discharge her from PT/OT/ST and potentially discharging her from current facility.  She continues to have left hemiparesis, left-sided neglect, cognitive concerns, dysphasia and intermittent expressive aphasia.  Daughter states she has seen some improvement but she  is still unable to ambulate and at times is able to stand pivot with a heavy to assist.  She continues to be on a pured diet due to swallowing issues and per daughter, swallowing exam was recently ordered but has not been scheduled at this time.  She needs mild to  moderate assistance with all ADLs.  She continues to take Eliquis without side effects of bleeding or bruising.  Is currently taking Zetia 10 mg daily without side effects noted.  Denies new or worsening stroke/TIA symptoms.  Interval history 09/01/2018: Patient is being seen today for follow-up visit and is accompanied by her daughter and son-in-law.  She continues to reside at [facility].  She has since completed all therapies as she was no longer progressing.  She continues to have left hemiparesis along with continued cognitive deficits.  Daughter states recently her memory has been worsening and has become more agitated, paranoid, crying and becoming very irate/upset when certain family members have not come to visit her.  Per daughter, the family members patient keeps asking for have passed away.  She was started on Lexapro at current facility approximately 2 months ago but daughter states this has not made much of a difference.  She also becomes irritable and anxious when she has too many layers of clothing on as she states they are all too tight.  She also complains of a burning sensation in her groin area on the left side which has been assessed by providers at facility without evidence of skin breakdown or irritation.  She continues on Eliquis without side effects of bleeding or bruising.  Continues on Zetia without side effects.  Blood pressure today 128/70.  No further concerns at this time.  Denies new or worsening stroke/TIA symptoms.    ROS:   14 system review of systems performed and negative with exception of trouble swallowing, leg swelling, memory loss, agitation and confusion   PMH:  Past Medical History:  Diagnosis Date  . Atrial fibrillation (HCC) 04/22/2018  . CVA (cerebral vascular accident) (HCC)   . Diverticulitis   . GERD (gastroesophageal reflux disease)   . Hyperlipidemia   . Hypertension     PSH:  Past Surgical History:  Procedure Laterality Date    . CARPAL TUNNEL RELEASE    . CATARACT EXTRACTION, BILATERAL    . HEMORROIDECTOMY    . KNEE ARTHROPLASTY      Social History:  Social History   Socioeconomic History  . Marital status: Widowed    Spouse name: Not on file  . Number of children: Not on file  . Years of education: Not on file  . Highest education level: Not on file  Occupational History  . Not on file  Social Needs  . Financial resource strain: Not on file  . Food insecurity:    Worry: Not on file    Inability: Not on file  . Transportation needs:    Medical: Not on file    Non-medical: Not on file  Tobacco Use  . Smoking status: Former Games developermoker  . Smokeless tobacco: Never Used  . Tobacco comment: LIGHT SMOKER OVER 30 YEARS AGO  Substance and Sexual Activity  . Alcohol use: Never    Frequency: Never  . Drug use: Never  . Sexual activity: Not on file  Lifestyle  . Physical activity:    Days per week: Not on file    Minutes per session: Not on file  . Stress: Not on file  Relationships  .  Social connections:    Talks on phone: Not on file    Gets together: Not on file    Attends religious service: Not on file    Active member of club or organization: Not on file    Attends meetings of clubs or organizations: Not on file    Relationship status: Not on file  . Intimate partner violence:    Fear of current or ex partner: Not on file    Emotionally abused: Not on file    Physically abused: Not on file    Forced sexual activity: Not on file  Other Topics Concern  . Not on file  Social History Narrative  . Not on file    Family History: History reviewed. No pertinent family history.  Medications:   Current Outpatient Medications on File Prior to Visit  Medication Sig Dispense Refill  . acetaminophen (TYLENOL) 325 MG tablet Take 2 tablets (650 mg total) by mouth at bedtime. 30 tablet 0  . apixaban (ELIQUIS) 5 MG TABS tablet Take 1 tablet (5 mg total) by mouth 2 (two) times daily. 60 tablet 3  .  dextromethorphan-guaiFENesin (ROBITUSSIN-DM) 10-100 MG/5ML liquid Take 20 mLs by mouth every 6 (six) hours as needed for cough.    . docusate (COLACE) 50 MG/5ML liquid Take by mouth 2 (two) times daily.    Marland Kitchen. ezetimibe (ZETIA) 10 MG tablet Take 1 tablet (10 mg total) by mouth daily. 30 tablet 0  . hydrOXYzine (ATARAX/VISTARIL) 25 MG tablet Take 25 mg by mouth every 8 (eight) hours as needed for anxiety or itching.    Marland Kitchen. LORazepam (ATIVAN) 0.5 MG tablet Take 0.5 mg by mouth at bedtime.  2  . meclizine (ANTIVERT) 25 MG tablet Take 25 mg by mouth 3 (three) times daily as needed for dizziness.   1  . metoprolol tartrate (LOPRESSOR) 25 MG tablet Take 0.5 tablets (12.5 mg total) by mouth 2 (two) times daily.    Marland Kitchen. omeprazole (PRILOSEC) 40 MG capsule Take 40 mg by mouth daily.    . polyethylene glycol (MIRALAX / GLYCOLAX) packet Take 17 g by mouth every other day.    . ranitidine (ZANTAC) 75 MG tablet Take 75 mg by mouth daily as needed for heartburn.    . traMADol (ULTRAM) 50 MG tablet Take 50 mg by mouth every 6 (six) hours as needed for moderate pain.    . traZODone (DESYREL) 50 MG tablet Take 12.5 mg by mouth every morning. Take 1/4 (12.5 mg) tablet by mouth in mornings for agitation     No current facility-administered medications on file prior to visit.     Allergies:   Allergies  Allergen Reactions  . Latex Rash     Physical Exam  Vitals:   09/01/18 1531  BP: 128/70  Pulse: 65  Height: 5\' 3"  (1.6 m)   Body mass index is 24.41 kg/m. No exam data present  General: well developed, well nourished, pleasant elderly Caucasian female, seated, in no evident distress Head: head normocephalic and atraumatic.   Neck: supple with no carotid or supraclavicular bruits Cardiovascular: regular rate and rhythm, no murmurs Musculoskeletal: no deformity Skin:  no rash/petichiae Vascular:  Normal pulses all extremities  Neurologic Exam Mental Status: Awake and fully alert.  Unable to adequately  assess orientation level due to possible interference with expressive aphasia.  Attention span, concentration and fund of knowledge appropriate. Mood and affect appropriate and cooperative throughout exam.  Cranial Nerves: Fundoscopic exam reveals sharp disc margins. Pupils equal,  briskly reactive to light. Extraocular movements full without nystagmus. Visual fields full to confrontation. Hearing intact. Facial sensation intact.  Left facial paralysis noted.  Motor: Normal bulk and tone.  LUE: 3/5 deltoid, 2/5 tricep, bicep and grip strength with spasticity; LLE: 2/5 hip flexor, 3/5 quad extension and flexion, 0/5 ankle dorsiflexion Sensory.:  Decreased sensation left upper and lower extremity Coordination: Rapid alternating movements normal on left side.  Gait and Station: Patient unable to ambulate Reflexes: 3+ left upper and lower extremities 1+ right upper and lower extremities toes downgoing.      Diagnostic Data (Labs, Imaging, Testing)  Ct Head Wo Contrast 04/18/2018 IMPRESSION:  1. No acute intracranial abnormality identified.  2. Mild chronic microvascular ischemic changes and volume loss of the brain for age. Small chronic infarct within the left anterior thalamus.   MR BRAIN without CONTRAST 04/18/2018 IMPRESSION: Multiple punctate acute infarctions in the deep brain and subcortical brain in the right basal ganglia, insular and subinsular region and right frontoparietal region, most consistent with watershed infarctions, given the relative absence of cortical infarction. No evidence of hemorrhage or swelling.  Ct Angio Head W Or Wo Contrast Ct Angio Neck W And/or Wo Contrast 04/18/2018 IMPRESSION:  1. Right M1 6 mm length segment of near occlusion, severe thread-like stenosis. Diminished downstream right MCA distribution in comparison with the left. CT perfusion of the head may be useful to assess for oligemia.  2. Calcific atherosclerosis of the aorta, carotid arteries, and  vertebral arteries. Multiple segments of mild stenosis in the anterior and posterior circulations.  3. No additional large vessel occlusion, hemodynamically significant stenosis by NASCET criteria, dissection, or vascular malformation.  4. 4 mm right upper lobe pulmonary nodule. No follow-up needed if patient is low-risk. Non-contrast chest CT can be considered in 12 months if patient is high-risk. This recommendation follows the consensus statement: Guidelines for Management of Incidental Pulmonary Nodules Detected on CT Images: From the Fleischner Society 2017; Radiology 2017; 284:228-243.  5. Trace bilateral pleural effusions and mild interstitial pulmonary edema.   Ct Angio Head W Or Wo Contrast Ct Angio Neck W Or Wo Contrast Ct Cerebral Perfusion W Contrast 04/22/2018 IMPRESSION:  1. Constellation of plain CT, CT Perfusion, and recent MRI findings consistent with a Subacute Right MCA infarct affecting the posterior right temporal lobe and periatrial white matter.  2. Right MCA M1 severe stenosis versus recent thrombo-embolic near occlusion without improvement since the 04/18/2018 CTA, but no interval branch occlusion.  3. Subsequent prolonged perfusion time throughout the Right MCA territory, with the worst Tmax area corresponding to the subacute infarct.  4. Stable arterial findings on CTA head and neck elsewhere, no new stenosis, but other notable atherosclerotic stenosis: - supraclinoid Right ICA siphon, moderate. - Left CCA origin up to 60%. - Left ICA bulb up to 65%.   Mr Brain Wo Contrast 04/22/2018 IMPRESSION:  1. Interval worsening in multiple right MCA territory infarcts, with multiple new/larger infarcts involving the right MCA distribution as above. Infarcts primarily watershed in nature. No associated hemorrhage.  2. Otherwise stable appearance of the brain. No other acute intracranial abnormality identified.   Ct Head Code Stroke Wo Contrast 04/22/2018 IMPRESSION:  1.  New/larger infarcts in the posterior right temporal lobe and posterior right insula. No hemorrhage.  2. ASPECTS is 8.  3. Evolving early subacute infarcts in the right basal ganglia and white matter as shown on recent prior MRI.   Transthoracic Echocardiogram  04/18/2018 Study Conclusions - Left ventricle: The  cavity size was normal. Wall thickness was normal. Systolic function was normal. The estimated ejection fraction was in the range of 60% to 65%. The study is not technically sufficient to allow evaluation of LV diastolic function. - Aortic valve: Sclerosis without stenosis. - Mitral valve: Severely calcified annulus. Moderately thickened, moderately calcified leaflets . The findings are consistent with mild stenosis. There was mild regurgitation. Valve area by continuity equation (using LVOT flow): 1 cm^2. - Left atrium: The atrium was moderately to severely dilated. - Atrial septum: No defect or patent foramen ovale was identified. - Pulmonary arteries: PA peak pressure: 43 mm Hg (S).    ASSESSMENT: Katelyn Mckinney is a 85 y.o. year old female here with multiple right MCA territory infarcts on 04/17/2018 secondary to intracranial stenosis versus cardioembolic source.  Patient return to ED on 04/22/2018 with new/larger right MCA territory infarcts secondary to severe right MCA stenosis in the setting of orthostatic hypotension along with newly diagnosed atrial fibrillation and was started on Eliquis. Vascular risk factors include HTN, HLD, intracranial stenosis and newly diagnosed AF.  Patient is being seen today for follow-up visit where she continues to have residual deficit of left hemiparesis and cognitive deficits.    PLAN: -Continue Eliquis (apixaban) daily  and Zetia 10 mg for secondary stroke prevention -Start Depakote 125 mg daily due to behaviors secondary to vascular dementia -F/u with PCP regarding your HLD and HTN management  -f/u with cardiology for  continued atrial fibrillation and Eliquis management -Recommended facility staff, patient and family provide range of motion to left arm to prevent further spasticity -continue to monitor BP at facility -Maintain strict control of hypertension with blood pressure goal between 1 20-1 40, diabetes with hemoglobin A1c goal below 6.5% and cholesterol with LDL cholesterol (bad cholesterol) goal below 70 mg/dL. I also advised the patient to eat a healthy diet with plenty of whole grains, cereals, fruits and vegetables, exercise regularly and maintain ideal body weight.  Follow up in 3 months or call earlier if needed   Greater than 50% of time during this 25 minute visit was spent on counseling,explanation of diagnosis of multiple right MCA territory infarcts, reviewing risk factor management of HLD, HTN, intracranial stenosis and newly diagnosed AF, planning of further management, discussion with patient and family and coordination of care    George Hugh, AGNP-BC  [facility] [address] [place], [place] [address]  Phone [phone] Fax [phone] Note: This document was prepared with digital dictation and possible smart phrase technology. Any transcriptional errors that result from this process are unintentional.

## 2018-09-01 NOTE — Patient Instructions (Signed)
Continue Eliquis (apixaban) daily  and Zetia  for secondary stroke prevention  Continue to follow up with PCP regarding cholesterol and blood pressure management along with monitoring lipid panel  Start Depakote 125mg  daily for behaviors and continued depression - request after 1 month if patient tolerating well dosage can be increased if behaviors continue  Continue to monitor blood pressure at home  Maintain strict control of hypertension with blood pressure goal below 130/90, diabetes with hemoglobin A1c goal below 6.5% and cholesterol with LDL cholesterol (bad cholesterol) goal below 70 mg/dL. I also advised the patient to eat a healthy diet with plenty of whole grains, cereals, fruits and vegetables, exercise regularly and maintain ideal body weight.  Followup in the future with me in 3 months or call earlier if needed       Thank you for coming to see [place] at [facility]. I hope we have been able to provide you high quality care today.  You may receive a patient satisfaction survey over the next few weeks. We would appreciate your feedback and comments so that we may continue to improve ourselves and the health of our patients.

## 2018-09-10 NOTE — Progress Notes (Signed)
I agree with the above plan 

## 2018-09-14 ENCOUNTER — Telehealth: Payer: Self-pay | Admitting: Adult Health

## 2018-09-14 NOTE — Telephone Encounter (Signed)
Phone call returned to Jacqulyn Bath, psychiatric NP in regards to possible Depakote adjustment.  She states over the weekend patient had worsening of her behaviors and was started on Ativan as she was verbally aggressive, combative and yelling. Hattie, NP requesting increase of Depakote from 125 mg daily to 125 mg twice daily along with initiating Namenda due to vascular dementia with behaviors which was agreed with.  It was agreed upon for her to continue to increase Depakote as needed in the future in regards to her dementia behaviors.

## 2018-09-14 NOTE — Telephone Encounter (Signed)
Please call Jacqulyn Bath, psychiatric nurse practioner about adjusting  depakote for the patient [phone] dg

## 2018-11-30 ENCOUNTER — Telehealth: Payer: Self-pay | Admitting: Adult Health

## 2018-11-30 NOTE — Telephone Encounter (Signed)
Tried to call pts daughter who cancel appt. Pts son in law stated Elease Hashimoto was driving and than the phone disconnected. Pt has depakote prescribed by Shanda Bumps NP.Any future refills need on this medication needs to be manage by PCP.

## 2018-11-30 NOTE — Telephone Encounter (Signed)
Called and spoke with pts daughter, stating she felt the appt is not needed at this time and would prefer to call back later in the year to f/u as needed.

## 2018-12-08 ENCOUNTER — Ambulatory Visit: Payer: Medicare Other | Admitting: Adult Health

## 2018-12-28 ENCOUNTER — Telehealth: Payer: Self-pay | Admitting: *Deleted

## 2018-12-28 NOTE — Telephone Encounter (Signed)
12/28/18 Per the patients daughter, they will call to schedule when she is able.

## 2019-01-03 ENCOUNTER — Emergency Department (HOSPITAL_COMMUNITY)
Admission: EM | Admit: 2019-01-03 | Discharge: 2019-01-04 | Disposition: A | Discharge status: Home / Self Care | Payer: Medicare Other | Attending: Emergency Medicine | Admitting: Emergency Medicine

## 2019-01-03 ENCOUNTER — Emergency Department (HOSPITAL_COMMUNITY): Payer: Medicare Other

## 2019-01-03 ENCOUNTER — Encounter (HOSPITAL_COMMUNITY): Payer: Self-pay | Admitting: Emergency Medicine

## 2019-01-03 DIAGNOSIS — R531 Weakness: Secondary | ICD-10-CM | POA: Insufficient documentation

## 2019-01-03 DIAGNOSIS — Z8673 Personal history of transient ischemic attack (TIA), and cerebral infarction without residual deficits: Secondary | ICD-10-CM | POA: Insufficient documentation

## 2019-01-03 DIAGNOSIS — Z7901 Long term (current) use of anticoagulants: Secondary | ICD-10-CM | POA: Diagnosis not present

## 2019-01-03 DIAGNOSIS — I959 Hypotension, unspecified: Secondary | ICD-10-CM | POA: Diagnosis present

## 2019-01-03 DIAGNOSIS — Z87891 Personal history of nicotine dependence: Secondary | ICD-10-CM | POA: Insufficient documentation

## 2019-01-03 DIAGNOSIS — Z79899 Other long term (current) drug therapy: Secondary | ICD-10-CM | POA: Insufficient documentation

## 2019-01-03 DIAGNOSIS — I10 Essential (primary) hypertension: Secondary | ICD-10-CM | POA: Diagnosis not present

## 2019-01-03 LAB — BASIC METABOLIC PANEL
Anion gap: 15 (ref 5–15)
BUN: 24 mg/dL — ABNORMAL HIGH (ref 8–23)
CO2: 25 mmol/L (ref 22–32)
Calcium: 8.8 mg/dL — ABNORMAL LOW (ref 8.9–10.3)
Chloride: 96 mmol/L — ABNORMAL LOW (ref 98–111)
Creatinine, Ser: 1.16 mg/dL — ABNORMAL HIGH (ref 0.44–1.00)
GFR calc Af Amer: 48 mL/min — ABNORMAL LOW (ref >60)
GFR calc non Af Amer: 41 mL/min — ABNORMAL LOW (ref >60)
Glucose, Bld: 96 mg/dL (ref 70–99)
Potassium: 4.1 mmol/L (ref 3.5–5.1)
Sodium: 136 mmol/L (ref 135–145)

## 2019-01-03 LAB — CBC WITH DIFFERENTIAL/PLATELET
Abs Immature Granulocytes: 0.04 10*3/uL (ref 0.00–0.07)
Basophils Absolute: 0 10*3/uL (ref 0.0–0.1)
Basophils Relative: 0 %
Eosinophils Absolute: 0.2 10*3/uL (ref 0.0–0.5)
Eosinophils Relative: 2 %
HCT: 39.7 % (ref 36.0–46.0)
Hemoglobin: 13 g/dL (ref 12.0–15.0)
Immature Granulocytes: 0 %
Lymphocytes Relative: 29 %
Lymphs Abs: 3.3 10*3/uL (ref 0.7–4.0)
MCH: 29.8 pg (ref 26.0–34.0)
MCHC: 32.7 g/dL (ref 30.0–36.0)
MCV: 91.1 fL (ref 80.0–100.0)
Monocytes Absolute: 1 10*3/uL (ref 0.1–1.0)
Monocytes Relative: 9 %
Neutro Abs: 6.8 10*3/uL (ref 1.7–7.7)
Neutrophils Relative %: 60 %
Platelets: 287 10*3/uL (ref 150–400)
RBC: 4.36 MIL/uL (ref 3.87–5.11)
RDW: 12.9 % (ref 11.5–15.5)
WBC: 11.4 10*3/uL — ABNORMAL HIGH (ref 4.0–10.5)
nRBC: 0 % (ref 0.0–0.2)

## 2019-01-03 LAB — URINALYSIS, MICROSCOPIC (REFLEX)

## 2019-01-03 LAB — URINALYSIS, ROUTINE W REFLEX MICROSCOPIC
Bilirubin Urine: NEGATIVE
Glucose, UA: NEGATIVE mg/dL
Ketones, ur: NEGATIVE mg/dL
Nitrite: NEGATIVE
Protein, ur: NEGATIVE mg/dL
Specific Gravity, Urine: <1.005 — ABNORMAL LOW (ref 1.005–1.030)
pH: 6 (ref 5.0–8.0)

## 2019-01-03 LAB — TROPONIN I: Troponin I: <0.03 ng/mL (ref <0.03)

## 2019-01-03 MED ORDER — LACTATED RINGERS IV BOLUS
1000.0000 mL | Freq: Once | INTRAVENOUS | Status: AC
  Administered 2019-01-03: 1000 mL via INTRAVENOUS

## 2019-01-03 MED ORDER — CEPHALEXIN 250 MG PO CAPS: 1000.0000 mg | ORAL_CAPSULE | Freq: Once | ORAL | Status: DC

## 2019-01-03 MED ORDER — CEPHALEXIN 500 MG PO CAPS: 500.0000 mg | ORAL_CAPSULE | Freq: Four times a day (QID) | ORAL | 0 refills | Status: AC

## 2019-01-03 NOTE — ED Triage Notes (Signed)
Pt from [facility], Thursday fell from wheelchair and struck left side of head on floor with positive hematoma and bruising, no send out Thursday, pt on blood thinner. Dr seen today for hypotensive per facility, sent her for eval. EMS vitals 132/72, HR 80 NSR, temp 99.9. left side deficits from CVA hx.

## 2019-01-03 NOTE — ED Provider Notes (Signed)
[facility] EMERGENCY DEPARTMENT Provider Note   CSN: [id number] Arrival date & time: 01/03/19  1712    History   Chief Complaint Chief Complaint  Patient presents with  . Hypotension    HPI Katelyn Mckinney is a 85 y.o. female.     HPI 85 year old female with past medical history of A. fib, hypertension, hyperlipidemia, on blood thinner, here with fall.  Patient states that she fell on Thursday.  She reportedly fell from her wheelchair, and struck the left side of her head.  There was no loss of consciousness.  Patient was at her baseline and per discussion with her daughter, it was decided that she would remain at home.  She was seen today again by the facility staff physician.  Per report, she seemed more tired and drowsy than usual.  She had a large hematoma to her left head.  She is also reportedly hypotensive to the 90s systolic.  She is subsequently sent here for further evaluation.  On my assessment, she states she has a mild left-sided headache but otherwise denies any pain.  Denies any recent fevers.  No cough.  She does admit to some mild intermittent confusion but states this is not abnormal for her necessarily.  She denies any arm or hip pain from the fall.  She is otherwise at her mental baseline per facility report and EMS as well as her own report.  Past Medical History:  Diagnosis Date  . Atrial fibrillation (HCC) 04/22/2018  . CVA (cerebral vascular accident) (HCC)   . Diverticulitis   . GERD (gastroesophageal reflux disease)   . Hyperlipidemia   . Hypertension     Patient Active Problem List   Diagnosis Date Noted  . Paroxysmal atrial fibrillation (HCC) 04/23/2018  . Atrial fibrillation with RVR (HCC)   . Non-rheumatic mitral valve stenosis   . Stroke (HCC) 04/22/2018  . Acute ischemic right MCA stroke (HCC)   . Essential hypertension   . Pure hypercholesterolemia   . Solitary pulmonary nodule   . CVA (cerebral vascular accident) (HCC)  04/18/2018    Past Surgical History:  Procedure Laterality Date  . CARPAL TUNNEL RELEASE    . CATARACT EXTRACTION, BILATERAL    . HEMORROIDECTOMY    . KNEE ARTHROPLASTY       OB History   No obstetric history on file.      Home Medications    Prior to Admission medications   Medication Sig Start Date End Date Taking? Authorizing Provider  acetaminophen (TYLENOL) 325 MG tablet Take 2 tablets (650 mg total) by mouth at bedtime. 04/27/18   Yvette Rack, MD  apixaban (ELIQUIS) 5 MG TABS tablet Take 1 tablet (5 mg total) by mouth 2 (two) times daily. 04/28/18   Yvette Rack, MD  cephALEXin (KEFLEX) 500 MG capsule Take 1 capsule (500 mg total) by mouth 4 (four) times daily. 01/03/19   Mesner, Barbara Cower, MD  dextromethorphan-guaiFENesin (ROBITUSSIN-DM) 10-100 MG/5ML liquid Take 20 mLs by mouth every 6 (six) hours as needed for cough.    [provider]  divalproex (DEPAKOTE) 125 MG DR tablet Take 1 tablet (125 mg total) by mouth daily. 09/01/18   George Hugh, NP  docusate (COLACE) 50 MG/5ML liquid Take by mouth 2 (two) times daily.    [provider]  ezetimibe (ZETIA) 10 MG tablet Take 1 tablet (10 mg total) by mouth daily. 04/20/18   Rehman, Areeg N, DO  hydrOXYzine (ATARAX/VISTARIL) 25 MG tablet Take 25  mg by mouth every 8 (eight) hours as needed for anxiety or itching.    [provider]  LORazepam (ATIVAN) 0.5 MG tablet Take 0.5 mg by mouth at bedtime. 02/22/18   [provider]  meclizine (ANTIVERT) 25 MG tablet Take 25 mg by mouth 3 (three) times daily as needed for dizziness.  01/16/18   [provider]  metoprolol tartrate (LOPRESSOR) 25 MG tablet Take 0.5 tablets (12.5 mg total) by mouth 2 (two) times daily. 07/16/18   Parke PoissonAcharya, Gayatri A, MD  omeprazole (PRILOSEC) 40 MG capsule Take 40 mg by mouth daily.    [provider]  polyethylene glycol (MIRALAX / GLYCOLAX) packet Take 17 g by mouth every other day.    [provider]  ranitidine (ZANTAC) 75 MG tablet Take 75 mg by mouth daily as needed for heartburn.    [provider]  traMADol (ULTRAM) 50 MG tablet Take 50 mg by mouth every 6 (six) hours as needed for moderate pain.    [provider]  traZODone (DESYREL) 50 MG tablet Take 12.5 mg by mouth every morning. Take 1/4 (12.5 mg) tablet by mouth in mornings for agitation    [provider]    Family History No family history on file.  Social History Social History   Tobacco Use  . Smoking status: Former Games developermoker  . Smokeless tobacco: Never Used  . Tobacco comment: LIGHT SMOKER OVER 30 YEARS AGO  Substance Use Topics  . Alcohol use: Never    Frequency: Never  . Drug use: Never     Allergies   Latex   Review of Systems Review of Systems  Constitutional: Positive for fatigue. Negative for chills and fever.  HENT: Negative for congestion and rhinorrhea.   Eyes: Negative for visual disturbance.  Respiratory: Negative for cough, shortness of breath and wheezing.   Cardiovascular: Negative for chest pain and leg swelling.  Gastrointestinal: Negative for abdominal pain, diarrhea, nausea and vomiting.  Genitourinary: Negative for dysuria and flank pain.  Musculoskeletal: Negative for neck pain and neck stiffness.  Skin: Negative for rash and wound.  Allergic/Immunologic: Negative for immunocompromised state.  Neurological: Positive for headaches. Negative for syncope.  Hematological: Bruises/bleeds easily.  All other systems reviewed and are negative.    Physical Exam Updated Vital Signs BP 105/74   Pulse 72   Temp 98.4 F (36.9 C) (Oral)   Resp 17   SpO2 100%   Physical Exam   ED Treatments / Results  Labs (all labs ordered are listed, but only abnormal results are displayed) Labs Reviewed  CBC WITH DIFFERENTIAL/PLATELET - Abnormal; Notable for the following components:      Result Value   WBC 11.4 (*)    All other components within normal limits   BASIC METABOLIC PANEL - Abnormal; Notable for the following components:   Chloride 96 (*)    BUN 24 (*)    Creatinine, Ser 1.16 (*)    Calcium 8.8 (*)    GFR calc non Af Amer 41 (*)    GFR calc Af Amer 48 (*)    All other components within normal limits  URINALYSIS, ROUTINE W REFLEX MICROSCOPIC - Abnormal; Notable for the following components:   Specific Gravity, Urine <1.005 (*)    Hgb urine dipstick SMALL (*)    Leukocytes,Ua LARGE (*)    All other components within normal limits  URINALYSIS, MICROSCOPIC (REFLEX) - Abnormal; Notable for the following components:   Bacteria, UA RARE (*)  All other components within normal limits  TROPONIN I    EKG EKG Interpretation  Date/Time:  Monday Jan 03 2019 17:30:40 EDT Ventricular Rate:  66 PR Interval:    QRS Duration: 92 QT Interval:  458 QTC Calculation: 480 R Axis:   61 Text Interpretation:  Sinus rhythm No significant change since last tracing Confirmed by Shaune Pollack [phone]) on 01/03/2019 7:44:00 PM Also confirmed by Shaune Pollack [phone]), editor Elita Quick [phone]  on 01/04/2019 8:10:59 AM   Radiology Dg Chest 2 View  Result Date: 01/03/2019 CLINICAL DATA:  Patient status post fall 4 days ago. EXAM: CHEST - 2 VIEW COMPARISON:  None. FINDINGS: There is mild cardiomegaly. Aortic atherosclerosis is noted. Elevation of the right hemidiaphragm is present. There is some right basilar atelectasis and a small amount pleural fluid scar at the right costophrenic angle. No pneumothorax. No acute or focal bony abnormality. IMPRESSION: No acute disease. Atherosclerosis. Electronically Signed   By: Drusilla Kanner M.D.   On: 01/03/2019 19:19   Ct Head Wo Contrast  Result Date: 01/03/2019 CLINICAL DATA:  86 year old female with C-spine trauma. EXAM: CT HEAD WITHOUT CONTRAST CT CERVICAL SPINE WITHOUT CONTRAST TECHNIQUE: Multidetector CT imaging of the head and cervical spine was performed following the standard protocol without  intravenous contrast. Multiplanar CT image reconstructions of the cervical spine were also generated. COMPARISON:  Head CT dated 04/22/2018 and brain MRI dated 04/22/2018 FINDINGS: CT HEAD FINDINGS Brain: There is mild to moderate age-related atrophy and chronic microvascular ischemic changes. Old right MCA territory infarct and encephalomalacia noted. There is no acute intracranial hemorrhage. No mass effect or midline shift. No extra-axial fluid collection. Vascular: No hyperdense vessel or unexpected calcification. Skull: Normal. Negative for fracture or focal lesion. Sinuses/Orbits: No acute finding. Other: Left temporal scalp hematoma. CT CERVICAL SPINE FINDINGS Alignment: No acute subluxation. Skull base and vertebrae: No acute fracture. Osteopenia. Soft tissues and spinal canal: No prevertebral fluid or swelling. No visible canal hematoma. Disc levels:  Multilevel degenerative changes. No acute findings. Upper chest: Partially visualized left pleural effusions. Other: Atherosclerotic calcification of the aortic arch as well as bilateral carotid bulb calcified plaques. IMPRESSION: 1. No acute intracranial hemorrhage. 2. Age-related atrophy and chronic microvascular ischemic changes. Old right MCA territory infarct and encephalomalacia. 3. No acute/traumatic cervical spine pathology. 4. Partially visualized left pleural effusion. Electronically Signed   By: Elgie Collard M.D.   On: 01/03/2019 19:49   Ct Cervical Spine Wo Contrast  Result Date: 01/03/2019 CLINICAL DATA:  85 year old female with C-spine trauma. EXAM: CT HEAD WITHOUT CONTRAST CT CERVICAL SPINE WITHOUT CONTRAST TECHNIQUE: Multidetector CT imaging of the head and cervical spine was performed following the standard protocol without intravenous contrast. Multiplanar CT image reconstructions of the cervical spine were also generated. COMPARISON:  Head CT dated 04/22/2018 and brain MRI dated 04/22/2018 FINDINGS: CT HEAD FINDINGS Brain: There is  mild to moderate age-related atrophy and chronic microvascular ischemic changes. Old right MCA territory infarct and encephalomalacia noted. There is no acute intracranial hemorrhage. No mass effect or midline shift. No extra-axial fluid collection. Vascular: No hyperdense vessel or unexpected calcification. Skull: Normal. Negative for fracture or focal lesion. Sinuses/Orbits: No acute finding. Other: Left temporal scalp hematoma. CT CERVICAL SPINE FINDINGS Alignment: No acute subluxation. Skull base and vertebrae: No acute fracture. Osteopenia. Soft tissues and spinal canal: No prevertebral fluid or swelling. No visible canal hematoma. Disc levels:  Multilevel degenerative changes. No acute findings. Upper chest: Partially visualized left pleural effusions. Other: Atherosclerotic  calcification of the aortic arch as well as bilateral carotid bulb calcified plaques. IMPRESSION: 1. No acute intracranial hemorrhage. 2. Age-related atrophy and chronic microvascular ischemic changes. Old right MCA territory infarct and encephalomalacia. 3. No acute/traumatic cervical spine pathology. 4. Partially visualized left pleural effusion. Electronically Signed   By: Elgie Collard M.D.   On: 01/03/2019 19:49    Procedures Procedures (including critical care time)  Medications Ordered in ED Medications  lactated ringers bolus 1,000 mL (0 mLs Intravenous Stopped 01/03/19 2355)     Initial Impression / Assessment and Plan / ED Course  I have reviewed the triage vital signs and the nursing notes.  Pertinent labs & imaging results that were available during my care of the patient were reviewed by me and considered in my medical decision making (see chart for details).        85 year old female here with reported hypotension and fatigue at her facility after fall several days ago.  On my exam, she has a large left-sided frontal scalp hematoma but no crepitance.  She appears to be at her neurological baseline.  She  is no longer hypotensive here.  Will check basic labs including urinalysis and chest x-ray to evaluate for occult infection, CT scans.  I suspect that if her scans are negative and lab work is reassuring, she can be discharged home.  No evidence to suggest ongoing sepsis or shock.  Final Clinical Impressions(s) / ED Diagnoses   Final diagnoses:  Weakness     Shaune Pollack, MD 01/04/19 1015

## 2019-01-03 NOTE — ED Notes (Signed)
Pt daughter Felipa Evener [phone]

## 2019-01-03 NOTE — ED Provider Notes (Signed)
Katelyn Mckinney is a 85 y.o. female, presenting to the ED following a fall and reported hypotension.   HPI from Shaune Pollack, MD: "HPI 85 year old female with past medical history of A. fib, hypertension, hyperlipidemia, on blood thinner, here with fall.  Patient states that she fell on Thursday.  She reportedly fell from her wheelchair, and struck the left side of her head.  There was no loss of consciousness.  Patient was at her baseline and per discussion with her daughter, it was decided that she would remain at home.  She was seen today again by the facility staff physician.  Per report, she seemed more tired and drowsy than usual.  She had a large hematoma to her left head.  She is also reportedly hypotensive to the 90s systolic.  She is subsequently sent here for further evaluation.  On my assessment, she states she has a mild left-sided headache but otherwise denies any pain.  Denies any recent fevers.  No cough.  She does admit to some mild intermittent confusion but states this is not abnormal for her necessarily.  She denies any arm or hip pain from the fall.  She is otherwise at her mental baseline per facility report and EMS as well as her own report."  Past Medical History:  Diagnosis Date  . Atrial fibrillation (HCC) 04/22/2018  . CVA (cerebral vascular accident) (HCC)   . Diverticulitis   . GERD (gastroesophageal reflux disease)   . Hyperlipidemia   . Hypertension     Physical Exam  BP (!) 149/61   Pulse 68   Temp 98.4 F (36.9 C) (Oral)   Resp (!) 22   SpO2 98%   Physical Exam Vitals signs and nursing note reviewed.  Constitutional:      General: She is not in acute distress.    Appearance: She is well-developed. She is not diaphoretic.  HENT:     Head: Normocephalic.     Mouth/Throat:     Mouth: Mucous membranes are moist.     Pharynx: Oropharynx is clear.  Eyes:     Conjunctiva/sclera: Conjunctivae normal.  Neck:     Musculoskeletal: Neck supple.   Cardiovascular:     Rate and Rhythm: Normal rate and regular rhythm.     Pulses: Normal pulses.     Heart sounds: Normal heart sounds.     Comments: Tactile temperature in the extremities appropriate and equal bilaterally. Pulmonary:     Effort: Pulmonary effort is normal. No respiratory distress.     Breath sounds: Normal breath sounds.  Abdominal:     Palpations: Abdomen is soft.     Tenderness: There is no abdominal tenderness. There is no guarding.  Musculoskeletal:     Right lower leg: No edema.     Left lower leg: No edema.  Lymphadenopathy:     Cervical: No cervical adenopathy.  Skin:    General: Skin is warm and dry.  Neurological:     Mental Status: She is alert and oriented to person, place, and time.  Psychiatric:        Mood and Affect: Mood and affect normal.        Speech: Speech normal.        Behavior: Behavior normal.     ED Course/Procedures     Procedures  Abnormal Labs Reviewed  CBC WITH DIFFERENTIAL/PLATELET - Abnormal; Notable for the following components:      Result Value   WBC 11.4 (*)    All other  components within normal limits  BASIC METABOLIC PANEL - Abnormal; Notable for the following components:   Chloride 96 (*)    BUN 24 (*)    Creatinine, Ser 1.16 (*)    Calcium 8.8 (*)    GFR calc non Af Amer 41 (*)    GFR calc Af Amer 48 (*)    All other components within normal limits   BUN  Date Value Ref Range Status  01/03/2019 24 (H) 8 - 23 mg/dL Final  16/05/9603 16 8 - 23 mg/dL Final  54/04/8118 14 8 - 23 mg/dL Final  14/78/2956 15 8 - 23 mg/dL Final   Creatinine, Ser  Date Value Ref Range Status  01/03/2019 1.16 (H) 0.44 - 1.00 mg/dL Final  21/30/8657 8.46 0.44 - 1.00 mg/dL Final  96/29/5284 1.32 (H) 0.44 - 1.00 mg/dL Final  44/08/270 5.36 0.44 - 1.00 mg/dL Final     Dg Chest 2 View  Result Date: 01/03/2019 CLINICAL DATA:  Patient status post fall 4 days ago. EXAM: CHEST - 2 VIEW COMPARISON:  None. FINDINGS: There is mild  cardiomegaly. Aortic atherosclerosis is noted. Elevation of the right hemidiaphragm is present. There is some right basilar atelectasis and a small amount pleural fluid scar at the right costophrenic angle. No pneumothorax. No acute or focal bony abnormality. IMPRESSION: No acute disease. Atherosclerosis. Electronically Signed   By: Drusilla Kanner M.D.   On: 01/03/2019 19:19   Ct Head Wo Contrast  Result Date: 01/03/2019 CLINICAL DATA:  85 year old female with C-spine trauma. EXAM: CT HEAD WITHOUT CONTRAST CT CERVICAL SPINE WITHOUT CONTRAST TECHNIQUE: Multidetector CT imaging of the head and cervical spine was performed following the standard protocol without intravenous contrast. Multiplanar CT image reconstructions of the cervical spine were also generated. COMPARISON:  Head CT dated 04/22/2018 and brain MRI dated 04/22/2018 FINDINGS: CT HEAD FINDINGS Brain: There is mild to moderate age-related atrophy and chronic microvascular ischemic changes. Old right MCA territory infarct and encephalomalacia noted. There is no acute intracranial hemorrhage. No mass effect or midline shift. No extra-axial fluid collection. Vascular: No hyperdense vessel or unexpected calcification. Skull: Normal. Negative for fracture or focal lesion. Sinuses/Orbits: No acute finding. Other: Left temporal scalp hematoma. CT CERVICAL SPINE FINDINGS Alignment: No acute subluxation. Skull base and vertebrae: No acute fracture. Osteopenia. Soft tissues and spinal canal: No prevertebral fluid or swelling. No visible canal hematoma. Disc levels:  Multilevel degenerative changes. No acute findings. Upper chest: Partially visualized left pleural effusions. Other: Atherosclerotic calcification of the aortic arch as well as bilateral carotid bulb calcified plaques. IMPRESSION: 1. No acute intracranial hemorrhage. 2. Age-related atrophy and chronic microvascular ischemic changes. Old right MCA territory infarct and encephalomalacia. 3. No  acute/traumatic cervical spine pathology. 4. Partially visualized left pleural effusion. Electronically Signed   By: Elgie Collard M.D.   On: 01/03/2019 19:49   Ct Cervical Spine Wo Contrast  Result Date: 01/03/2019 CLINICAL DATA:  85 year old female with C-spine trauma. EXAM: CT HEAD WITHOUT CONTRAST CT CERVICAL SPINE WITHOUT CONTRAST TECHNIQUE: Multidetector CT imaging of the head and cervical spine was performed following the standard protocol without intravenous contrast. Multiplanar CT image reconstructions of the cervical spine were also generated. COMPARISON:  Head CT dated 04/22/2018 and brain MRI dated 04/22/2018 FINDINGS: CT HEAD FINDINGS Brain: There is mild to moderate age-related atrophy and chronic microvascular ischemic changes. Old right MCA territory infarct and encephalomalacia noted. There is no acute intracranial hemorrhage. No mass effect or midline shift. No extra-axial fluid collection. Vascular:  No hyperdense vessel or unexpected calcification. Skull: Normal. Negative for fracture or focal lesion. Sinuses/Orbits: No acute finding. Other: Left temporal scalp hematoma. CT CERVICAL SPINE FINDINGS Alignment: No acute subluxation. Skull base and vertebrae: No acute fracture. Osteopenia. Soft tissues and spinal canal: No prevertebral fluid or swelling. No visible canal hematoma. Disc levels:  Multilevel degenerative changes. No acute findings. Upper chest: Partially visualized left pleural effusions. Other: Atherosclerotic calcification of the aortic arch as well as bilateral carotid bulb calcified plaques. IMPRESSION: 1. No acute intracranial hemorrhage. 2. Age-related atrophy and chronic microvascular ischemic changes. Old right MCA territory infarct and encephalomalacia. 3. No acute/traumatic cervical spine pathology. 4. Partially visualized left pleural effusion. Electronically Signed   By: Elgie CollardArash  Radparvar M.D.   On: 01/03/2019 19:49     EKG Interpretation  Date/Time:  Monday Jan 03 2019 17:30:40 EDT Ventricular Rate:  66 PR Interval:    QRS Duration: 92 QT Interval:  458 QTC Calculation: 480 R Axis:   61 Text Interpretation:  Sinus rhythm No significant change since last tracing Confirmed by Shaune PollackIsaacs, Cameron [phone]) on 01/03/2019 7:44:00 PM        MDM    Patient care handoff report received from Shaune Pollackameron Isaacs, MD. Plan: Review pending labs and CT results.  Expect discharge.  Patient had no complaints during my time with her in the ED.  She has slight increase in her creatinine, 1.16 today, increased from 0.90 eight months ago.  Chloride was also mildly decreased.  She received a fluid bolus in response to these findings. There were no acute abnormalities on the imaging studies today.  End of shift patient care handoff report given to Marily MemosJason Mesner, MD Plan: UA pending.  Expectation for discharge following results.   Vitals:   01/03/19 1731 01/03/19 1745 01/03/19 1800 01/03/19 1815  BP: (!) 147/62 (!) 150/63 (!) 153/58 (!) 149/61  Pulse: 66 67 67 68  Resp: (!) 21 18 16  (!) 22  Temp: 98.4 F (36.9 C)     TempSrc: Oral     SpO2: 98% 98% 97% 98%       Anselm PancoastJoy, Solangel Mcmanaway C, PA-C 01/03/19 2238    Shaune PollackIsaacs, Cameron, MD 01/04/19 1020

## 2019-01-03 NOTE — ED Provider Notes (Signed)
9:21 PM Assumed care from Dr. Erma Heritage and Yancey Flemings, PA-C, please see their note for full history, physical and decision making until this point. In brief this is a 85 y.o. year old female who presented to the ED tonight with Hypotension     Fall on Thursday. Head trauma. On blood thinner. Questionable hypotension in facility today. Here labs ok. VS WNL and stable. Awaiting urine.   Urine with questionable infection. Culture sent abx. Started. BP's stable and normal whole time in ER. Asymptomatic. Stable for discharge at this time.   Discharge instructions, including strict return precautions for new or worsening symptoms, given. Patient and/or family verbalized understanding and agreement with the plan as described.   Labs, studies and imaging reviewed by myself and considered in medical decision making if ordered. Imaging interpreted by radiology.  Labs Reviewed  CBC WITH DIFFERENTIAL/PLATELET - Abnormal; Notable for the following components:      Result Value   WBC 11.4 (*)    All other components within normal limits  BASIC METABOLIC PANEL - Abnormal; Notable for the following components:   Chloride 96 (*)    BUN 24 (*)    Creatinine, Ser 1.16 (*)    Calcium 8.8 (*)    GFR calc non Af Amer 41 (*)    GFR calc Af Amer 48 (*)    All other components within normal limits  URINALYSIS, ROUTINE W REFLEX MICROSCOPIC - Abnormal; Notable for the following components:   Specific Gravity, Urine <1.005 (*)    Hgb urine dipstick SMALL (*)    Leukocytes,Ua LARGE (*)    All other components within normal limits  URINALYSIS, MICROSCOPIC (REFLEX) - Abnormal; Notable for the following components:   Bacteria, UA RARE (*)    All other components within normal limits  TROPONIN I    CT Head Wo Contrast  Final Result    CT Cervical Spine Wo Contrast  Final Result    DG Chest 2 View  Final Result      No follow-ups on file.    Marily Memos, MD 01/05/19 [phone]

## 2019-06-26 ENCOUNTER — Other Ambulatory Visit: Payer: Self-pay

## 2019-06-26 ENCOUNTER — Emergency Department (HOSPITAL_COMMUNITY)
Admission: EM | Admit: 2019-06-26 | Discharge: 2019-06-26 | Disposition: A | Discharge status: Home / Self Care | Payer: Medicare Other | Attending: Emergency Medicine | Admitting: Emergency Medicine

## 2019-06-26 ENCOUNTER — Encounter (HOSPITAL_COMMUNITY): Payer: Self-pay | Admitting: Emergency Medicine

## 2019-06-26 ENCOUNTER — Emergency Department (HOSPITAL_COMMUNITY): Payer: Medicare Other

## 2019-06-26 DIAGNOSIS — Z79899 Other long term (current) drug therapy: Secondary | ICD-10-CM | POA: Insufficient documentation

## 2019-06-26 DIAGNOSIS — I10 Essential (primary) hypertension: Secondary | ICD-10-CM | POA: Diagnosis not present

## 2019-06-26 DIAGNOSIS — R0602 Shortness of breath: Secondary | ICD-10-CM | POA: Insufficient documentation

## 2019-06-26 DIAGNOSIS — R05 Cough: Secondary | ICD-10-CM | POA: Diagnosis present

## 2019-06-26 DIAGNOSIS — R197 Diarrhea, unspecified: Secondary | ICD-10-CM | POA: Insufficient documentation

## 2019-06-26 DIAGNOSIS — Z91040 Latex allergy status: Secondary | ICD-10-CM | POA: Diagnosis not present

## 2019-06-26 DIAGNOSIS — Z8673 Personal history of transient ischemic attack (TIA), and cerebral infarction without residual deficits: Secondary | ICD-10-CM | POA: Diagnosis not present

## 2019-06-26 DIAGNOSIS — Z87891 Personal history of nicotine dependence: Secondary | ICD-10-CM | POA: Diagnosis not present

## 2019-06-26 DIAGNOSIS — Z96659 Presence of unspecified artificial knee joint: Secondary | ICD-10-CM | POA: Insufficient documentation

## 2019-06-26 DIAGNOSIS — Z7901 Long term (current) use of anticoagulants: Secondary | ICD-10-CM | POA: Diagnosis not present

## 2019-06-26 DIAGNOSIS — R509 Fever, unspecified: Secondary | ICD-10-CM | POA: Insufficient documentation

## 2019-06-26 DIAGNOSIS — U071 COVID-19: Secondary | ICD-10-CM

## 2019-06-26 DIAGNOSIS — R059 Cough, unspecified: Secondary | ICD-10-CM

## 2019-06-26 DIAGNOSIS — F0390 Unspecified dementia without behavioral disturbance: Secondary | ICD-10-CM | POA: Diagnosis not present

## 2019-06-26 HISTORY — DX: Unspecified dementia, unspecified severity, without behavioral disturbance, psychotic disturbance, mood disturbance, and anxiety: F03.90

## 2019-06-26 LAB — CBC WITH DIFFERENTIAL/PLATELET
Abs Immature Granulocytes: 0.07 10*3/uL (ref 0.00–0.07)
Basophils Absolute: 0 10*3/uL (ref 0.0–0.1)
Basophils Relative: 0 %
Eosinophils Absolute: 0.1 10*3/uL (ref 0.0–0.5)
Eosinophils Relative: 1 %
HCT: 35.3 % — ABNORMAL LOW (ref 36.0–46.0)
Hemoglobin: 11.5 g/dL — ABNORMAL LOW (ref 12.0–15.0)
Immature Granulocytes: 1 %
Lymphocytes Relative: 30 %
Lymphs Abs: 3 10*3/uL (ref 0.7–4.0)
MCH: 30.4 pg (ref 26.0–34.0)
MCHC: 32.6 g/dL (ref 30.0–36.0)
MCV: 93.4 fL (ref 80.0–100.0)
Monocytes Absolute: 0.7 10*3/uL (ref 0.1–1.0)
Monocytes Relative: 7 %
Neutro Abs: 6.2 10*3/uL (ref 1.7–7.7)
Neutrophils Relative %: 61 %
Platelets: 369 10*3/uL (ref 150–400)
RBC: 3.78 MIL/uL — ABNORMAL LOW (ref 3.87–5.11)
RDW: 13.3 % (ref 11.5–15.5)
WBC: 10.1 10*3/uL (ref 4.0–10.5)
nRBC: 0 % (ref 0.0–0.2)

## 2019-06-26 LAB — COMPREHENSIVE METABOLIC PANEL
ALT: 17 U/L (ref 0–44)
AST: 22 U/L (ref 15–41)
Albumin: 2.2 g/dL — ABNORMAL LOW (ref 3.5–5.0)
Alkaline Phosphatase: 79 U/L (ref 38–126)
Anion gap: 11 (ref 5–15)
BUN: 10 mg/dL (ref 8–23)
CO2: 27 mmol/L (ref 22–32)
Calcium: 8.7 mg/dL — ABNORMAL LOW (ref 8.9–10.3)
Chloride: 98 mmol/L (ref 98–111)
Creatinine, Ser: 0.84 mg/dL (ref 0.44–1.00)
GFR calc Af Amer: >60 mL/min (ref >60)
GFR calc non Af Amer: >60 mL/min (ref >60)
Glucose, Bld: 90 mg/dL (ref 70–99)
Potassium: 3.8 mmol/L (ref 3.5–5.1)
Sodium: 136 mmol/L (ref 135–145)
Total Bilirubin: 0.4 mg/dL (ref 0.3–1.2)
Total Protein: 6.3 g/dL — ABNORMAL LOW (ref 6.5–8.1)

## 2019-06-26 LAB — TRIGLYCERIDES: Triglycerides: 108 mg/dL (ref <150)

## 2019-06-26 LAB — PROCALCITONIN: Procalcitonin: 0.13 ng/mL

## 2019-06-26 LAB — C-REACTIVE PROTEIN: CRP: 12.1 mg/dL — ABNORMAL HIGH (ref <1.0)

## 2019-06-26 LAB — LACTIC ACID, PLASMA: Lactic Acid, Venous: 1.2 mmol/L (ref 0.5–1.9)

## 2019-06-26 LAB — LACTATE DEHYDROGENASE: LDH: 167 U/L (ref 98–192)

## 2019-06-26 LAB — FERRITIN: Ferritin: 309 ng/mL — ABNORMAL HIGH (ref 11–307)

## 2019-06-26 NOTE — ED Provider Notes (Signed)
[facility] EMERGENCY DEPARTMENT Provider Note   CSN: [id number] Arrival date & time: 06/26/19  1231     History   Chief Complaint Chief Complaint  Patient presents with  . Cough  . COVID    HPI Katelyn Mckinney is a 85 y.o. female.     Katelyn Mckinney is a 85 y.o. female with a history of dementia, hypertension, hyperlipidemia, GERD, CVA, and A. fib, who presents to the ED via EMS from [facility] for evaluation of cough with positive Covid test this morning.  Patient reports that she has had a worsening cough over the past few days and last night experienced some shortness of breath but otherwise denies any symptoms, is pleasantly demented and able to provide limited history.  I spoke with nursing staff at [facility] who report that patient has had worsening cough with some intermittent episodes of increased work of breathing, and this morning patient was febrile at 101.6, was given Tylenol.  She was also noted to have one episode of diarrhea.  Facility was concerned with her fevers and intermittent breathing and inability to communicate well due to dementia and so sent here in for further evaluation.  Level 5 caveat: Dementia     Past Medical History:  Diagnosis Date  . Atrial fibrillation ([place]) 04/22/2018  . CVA (cerebral vascular accident) ([place])   . Dementia ([place])   . Diverticulitis   . GERD (gastroesophageal reflux disease)   . Hyperlipidemia   . Hypertension     Patient Active Problem List   Diagnosis Date Noted  . Paroxysmal atrial fibrillation ([place]) 04/23/2018  . Atrial fibrillation with RVR ([place])   . Non-rheumatic mitral valve stenosis   . Stroke ([place]) 04/22/2018  . Acute ischemic right MCA stroke ([place])   . Essential hypertension   . Pure hypercholesterolemia   . Solitary pulmonary nodule   . CVA (cerebral vascular accident) ([place]) 04/18/2018    Past Surgical History:  Procedure Laterality Date  . CARPAL TUNNEL RELEASE    . CATARACT  EXTRACTION, BILATERAL    . HEMORROIDECTOMY    . KNEE ARTHROPLASTY       OB History   No obstetric history on file.      Home Medications    Prior to Admission medications   Medication Sig Start Date End Date Taking? Authorizing Provider  acetaminophen (TYLENOL) 325 MG tablet Take 2 tablets (650 mg total) by mouth at bedtime. 04/27/18   Jean Rosenthal, MD  apixaban (ELIQUIS) 5 MG TABS tablet Take 1 tablet (5 mg total) by mouth 2 (two) times daily. 04/28/18   Jean Rosenthal, MD  cephALEXin (KEFLEX) 500 MG capsule Take 1 capsule (500 mg total) by mouth 4 (four) times daily. 01/03/19   Mesner, Corene Cornea, MD  dextromethorphan-guaiFENesin (ROBITUSSIN-DM) 10-100 MG/5ML liquid Take 20 mLs by mouth every 6 (six) hours as needed for cough.    [provider]  divalproex (DEPAKOTE) 125 MG DR tablet Take 1 tablet (125 mg total) by mouth daily. 09/01/18   Frann Rider, NP  docusate (COLACE) 50 MG/5ML liquid Take by mouth 2 (two) times daily.    [provider]  ezetimibe (ZETIA) 10 MG tablet Take 1 tablet (10 mg total) by mouth daily. 04/20/18   Rehman, Areeg N, DO  hydrOXYzine (ATARAX/VISTARIL) 25 MG tablet Take 25 mg by mouth every 8 (eight) hours as needed for anxiety or itching.    [provider]  LORazepam (ATIVAN) 0.5 MG tablet  Take 0.5 mg by mouth at bedtime. 02/22/18   [provider]  meclizine (ANTIVERT) 25 MG tablet Take 25 mg by mouth 3 (three) times daily as needed for dizziness.  01/16/18   [provider]  metoprolol tartrate (LOPRESSOR) 25 MG tablet Take 0.5 tablets (12.5 mg total) by mouth 2 (two) times daily. 07/16/18   Parke Poisson, MD  omeprazole (PRILOSEC) 40 MG capsule Take 40 mg by mouth daily.    [provider]  polyethylene glycol (MIRALAX / GLYCOLAX) packet Take 17 g by mouth every other day.    [provider]  ranitidine (ZANTAC) 75 MG tablet Take 75 mg by mouth daily as needed for heartburn.    [provider]  traMADol (ULTRAM) 50 MG tablet Take 50 mg by mouth every 6 (six) hours as needed for moderate pain.    [provider]  traZODone (DESYREL) 50 MG tablet Take 12.5 mg by mouth every morning. Take 1/4 (12.5 mg) tablet by mouth in mornings for agitation    [provider]    Family History No family history on file.  Social History Social History   Tobacco Use  . Smoking status: Former Games developer  . Smokeless tobacco: Never Used  . Tobacco comment: LIGHT SMOKER OVER 30 YEARS AGO  Substance Use Topics  . Alcohol use: Never    Frequency: Never  . Drug use: Never     Allergies   Latex   Review of Systems Review of Systems  Unable to perform ROS: Dementia     Physical Exam Updated Vital Signs BP (!) 109/52 (BP Location: Right Arm)   Pulse 90   Temp (!) 97.4 F (36.3 C) (Oral)   Resp 20   SpO2 99%   Physical Exam Vitals signs and nursing note reviewed.  Constitutional:      General: She is not in acute distress.    Appearance: Normal appearance. She is well-developed and normal weight. She is not diaphoretic.     Comments: Pleasantly demented elderly female laying comfortably on stretcher  HENT:     Head: Normocephalic and atraumatic.     Mouth/Throat:     Mouth: Mucous membranes are moist.     Pharynx: Oropharynx is clear.     Comments: Posterior oropharynx clear, mucous membranes moist. Eyes:     General:        Right eye: No discharge.        Left eye: No discharge.     Pupils: Pupils are equal, round, and reactive to light.  Neck:     Musculoskeletal: Neck supple.  Cardiovascular:     Rate and Rhythm: Normal rate and regular rhythm.     Heart sounds: Normal heart sounds. No murmur. No friction rub. No gallop.   Pulmonary:     Effort: Pulmonary effort is normal. No respiratory distress.     Breath sounds: Normal breath sounds. No wheezing or rales.     Comments: Respirations equal and unlabored, patient able to speak in full sentences,  lungs clear with slightly diminished breath sounds.  Intermittent cough during exam. Abdominal:     General: Bowel sounds are normal. There is no distension.     Palpations: Abdomen is soft. There is no mass.     Tenderness: There is no abdominal tenderness. There is no guarding.     Comments: Abdomen soft, nondistended, nontender to palpation in all quadrants without guarding or peritoneal signs  Musculoskeletal:  General: No deformity.     Right lower leg: No edema.     Left lower leg: No edema.  Skin:    General: Skin is warm and dry.     Capillary Refill: Capillary refill takes less than 2 seconds.  Neurological:     Mental Status: She is alert.     Coordination: Coordination normal.     Comments: Speech is clear, able to follow commands Moves extremities without ataxia, coordination intact  Psychiatric:        Mood and Affect: Mood normal.        Behavior: Behavior normal.      ED Treatments / Results  Labs (all labs ordered are listed, but only abnormal results are displayed) Labs Reviewed  CULTURE, BLOOD (ROUTINE X 2)  CULTURE, BLOOD (ROUTINE X 2)  LACTIC ACID, PLASMA  LACTIC ACID, PLASMA  CBC WITH DIFFERENTIAL/PLATELET  COMPREHENSIVE METABOLIC PANEL  D-DIMER, QUANTITATIVE (NOT AT [facility])  PROCALCITONIN  LACTATE DEHYDROGENASE  FERRITIN  TRIGLYCERIDES  FIBRINOGEN  C-REACTIVE PROTEIN    EKG None  Radiology No results found.  Procedures Procedures (including critical care time)  Medications Ordered in ED Medications - No data to display   Initial Impression / Assessment and Plan / ED Course  I have reviewed the triage vital signs and the nursing notes.  Pertinent labs & imaging results that were available during my care of the patient were reviewed by me and considered in my medical decision making (see chart for details).  85 year old female presents after testing positive for COVID-19 at her nursing facility.  Patient reports a cough and mild  sore throat last night, facility states that she was febrile at 101.6 today, with worsening cough and intermittent increased work of breathing, she also had one episode of diarrhea and so they sent her in for further evaluation.  Due to her dementia she is able to provide limited history.  Lungs with some mild expiratory wheezing but good air movement, patient satting 99% on room air and breathing comfortably.  Will get chest x-ray and Covid labs and inflammatory markers.  If patient has Covid pneumonia or lab derangements low threshold for admission given her age.  At shift change care signed out to PA Terance HartKelly Gekas who will follow up on chest x-ray and labs and disposition appropriately.  Final Clinical Impressions(s) / ED Diagnoses   Final diagnoses:  COVID-19 virus infection  Cough    ED Discharge Orders    None       Legrand RamsFord, Taqwa Deem N, PA-C 06/26/19 1530    Gerhard MunchLockwood, Robert, MD 06/27/19 1236

## 2019-06-26 NOTE — ED Notes (Signed)
Katelyn Mckinney [phone] daughter please call and give an update

## 2019-06-26 NOTE — ED Provider Notes (Signed)
Pt received in sign out.  She is a 85 year old female who is a resident at [facility].  She was sent here for testing positive for Covid and not able to give a history.  She has had fever, increased work of breathing, and cough.  She is mildly tachypneic and blood pressures are soft which on review of EMR appears to be here at baseline.  Lab work and chest x-ray are pending at shift change  CBC is remarkable for mild anemia.  CMP is overall normal.  Chest x-ray is negative.  There is been no episodes of hypoxia and she has been hemodynamically stable throughout her ED stay.  Discussed results with her daughter Mardene Celeste advised that we will send her back to [facility].  She is in agreement with plan.  She was encouraged to follow-up on her mother's condition tomorrow.   Recardo Evangelist, PA-C 06/26/19 [place]    Wyvonnia Dusky, MD 06/27/19 1137

## 2019-06-26 NOTE — ED Triage Notes (Addendum)
Pt to triage via [place] from [facility]. Positive COVID test this morning.  Reports non-productive cough that started this morning.  Takes Tylenol as daily medication- no fever per EMS.  Hx of dementia. Pt on stretcher in triage.

## 2019-07-01 LAB — CULTURE, BLOOD (ROUTINE X 2)
Culture: NO GROWTH
Culture: NO GROWTH
Special Requests: ADEQUATE

## 2019-09-06 IMAGING — MR MR HEAD W/O CM
9 of 10 series · 35 of 48 positions shown · non-contrast
Comparison: CT studies earlier same day.

CLINICAL DATA: Feelings of lethargy and weakness. Symptoms began
yesterday. Ataxia. Right middle cerebral artery disease by CT
angiography.

EXAM:
MRI HEAD WITHOUT CONTRAST
TECHNIQUE: Multiplanar, multiecho pulse sequences of the brain and surrounding
structures were obtained without intravenous contrast.

[Series 3: DWI · axial · 3.0mm · 0.94mm/px · z∈[-79,+67]mm · 8 of 100 slices shown (1 of 2)]
[im 1/100]
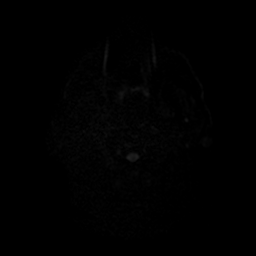
[im 12/100]
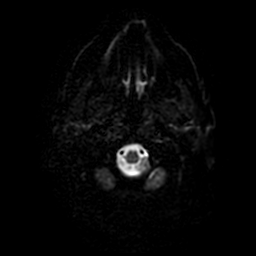
[im 34/100]
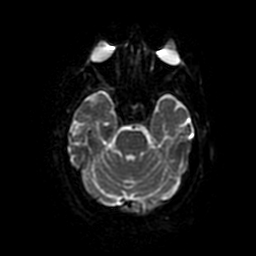
[im 45/100]
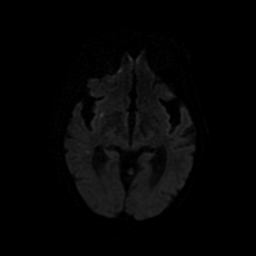
[im 56/100]
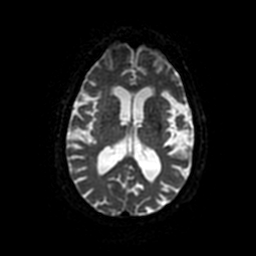
[im 67/100]
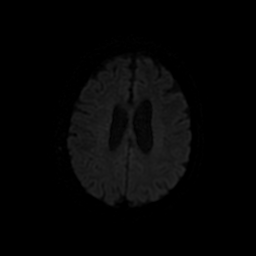
[im 89/100]
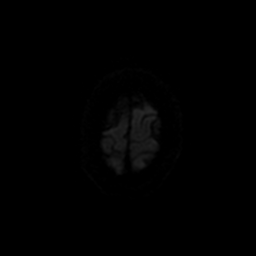
[im 100/100]
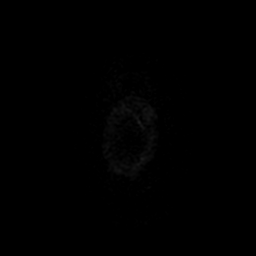

[Series 4: DWI · coronal · 4.0mm · 0.94mm/px · 7 of 72 slices shown (2 of 2)]
[im 1/72]
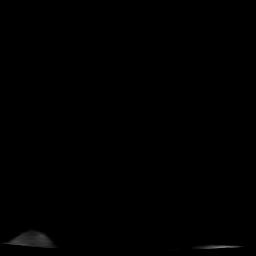
[im 12/72]
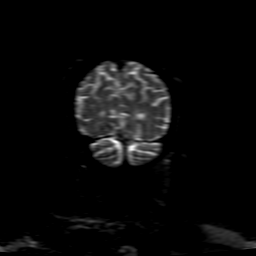
[im 24/72]
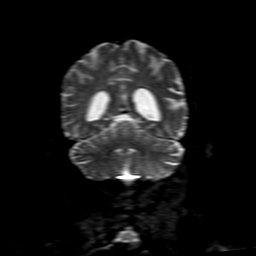
[im 36/72]
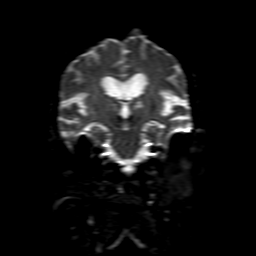
[im 48/72]
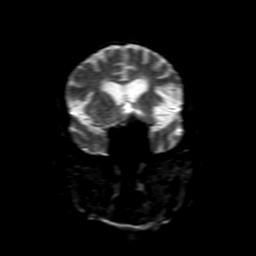
[im 60/72]
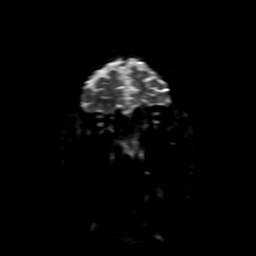
[im 72/72]
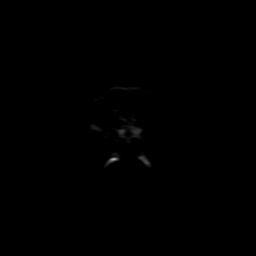

[Series 5: FLAIR · sagittal · 5.0mm · 0.47mm/px · 2 of 23 slices shown (1 of 2)]
[im 1/23]
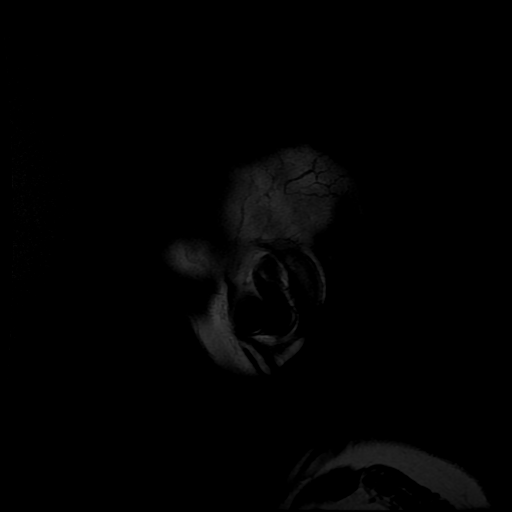
[im 23/23]
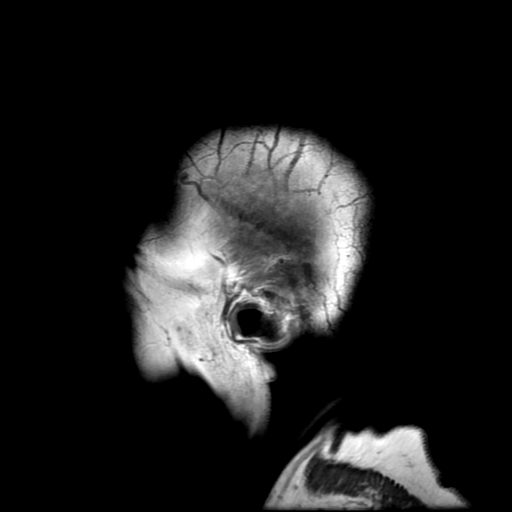

[Series 7: T2 · axial · 5.0mm · 0.47mm/px · z∈[-77,+65]mm · 2 of 25 slices shown]
[im 1/25]
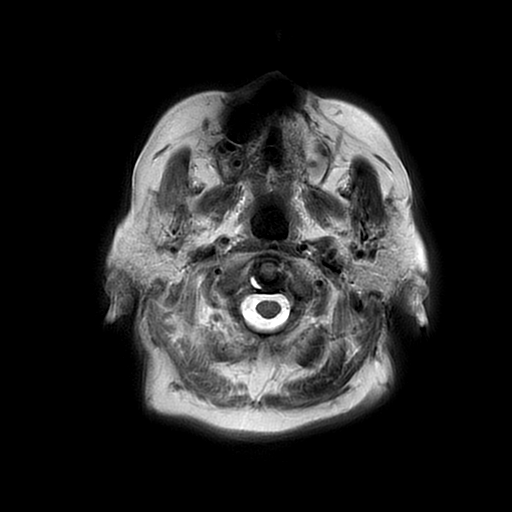
[im 25/25]
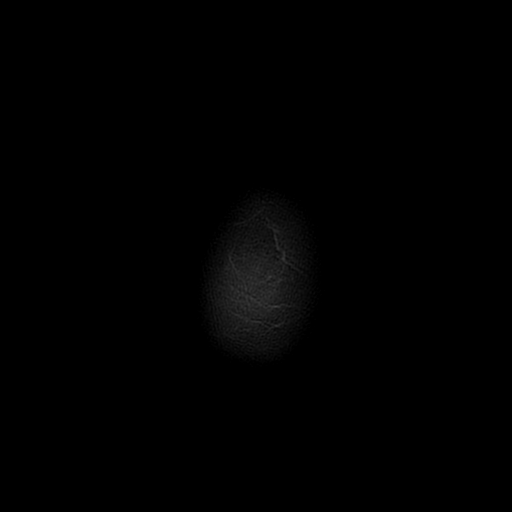

[Series 8: FLAIR · axial · 3.0mm · 0.47mm/px · z∈[-77,+65]mm · 2 of 25 slices shown (2 of 2)]
[im 1/25]
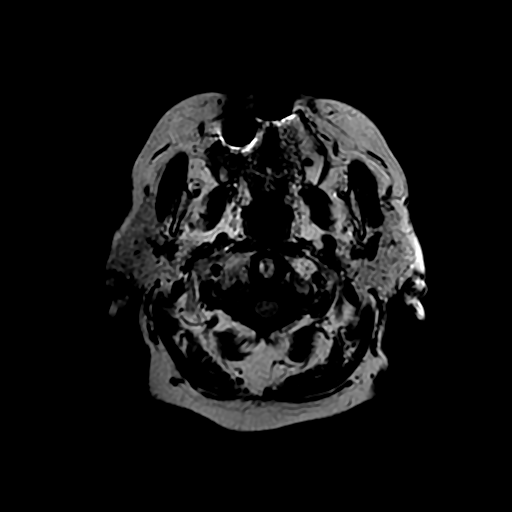
[im 25/25]
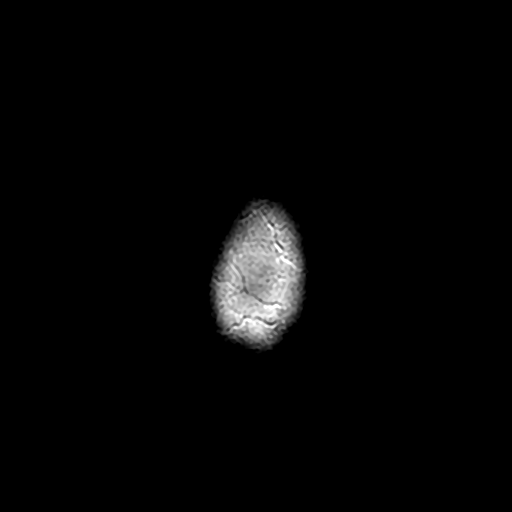

[Series 9: (person_name) · axial · 3.0mm · 0.47mm/px · z∈[-79,-43]mm · 3 of 100 slices shown]
[im 1/100]
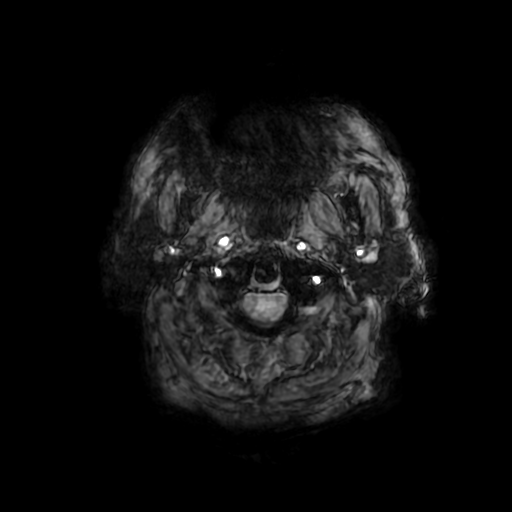
[im 13/100]
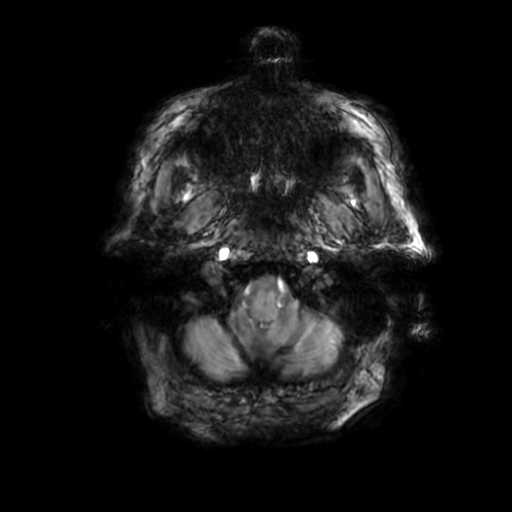
[im 25/100]
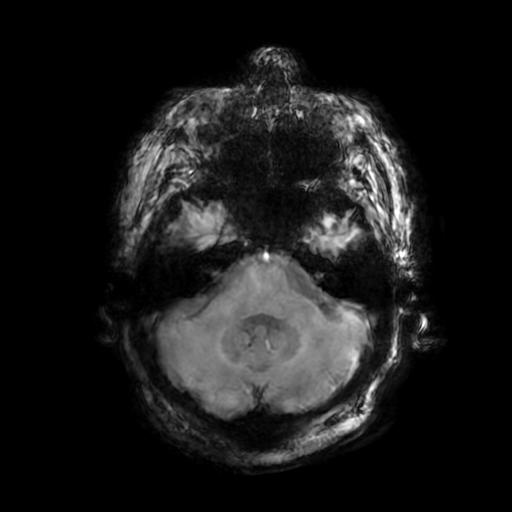

[Series 11: T2 post-contrast · coronal · 5.0mm · 0.47mm/px · 3 of 30 slices shown]
[im 1/30]
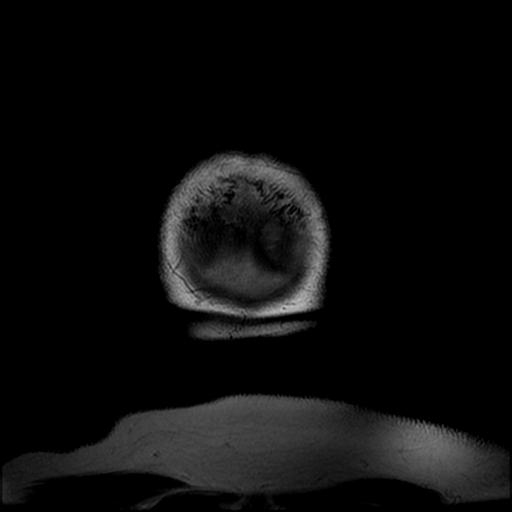
[im 15/30]
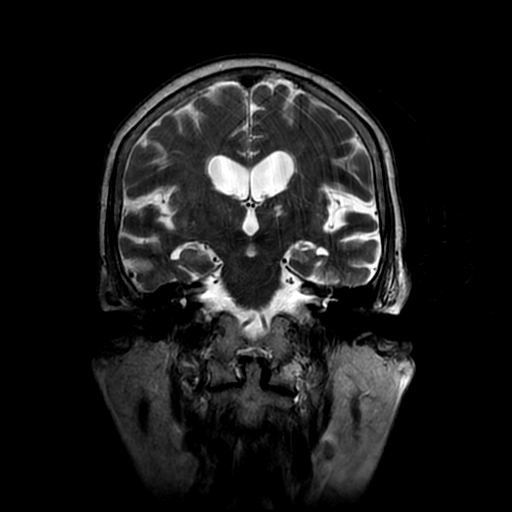
[im 30/30]
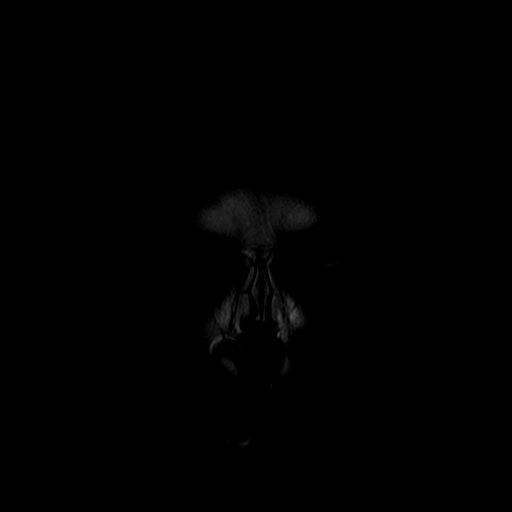

[Series 350: ADC · axial · 3.0mm · 0.94mm/px · z∈[-79,+67]mm · 5 of 50 slices shown (1 of 2)]
[im 1/50]
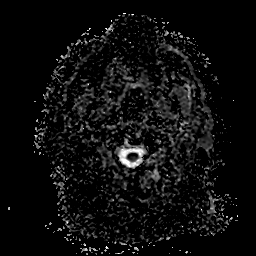
[im 13/50]
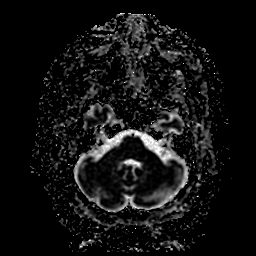
[im 25/50]
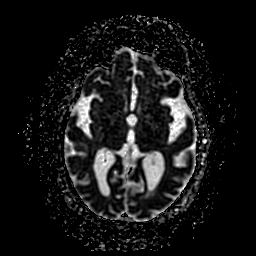
[im 37/50]
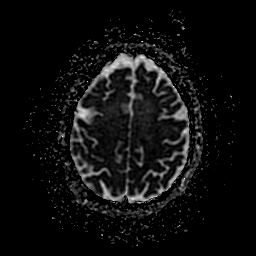
[im 50/50]
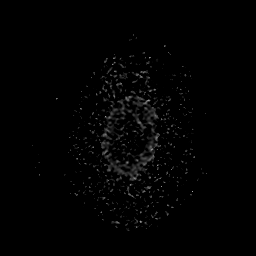

[Series 450: ADC · coronal · 4.0mm · 0.94mm/px · 3 of 36 slices shown (2 of 2)]
[im 1/36]
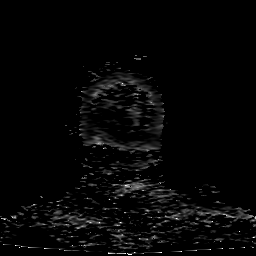
[im 18/36]
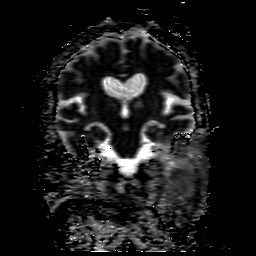
[im 36/36]
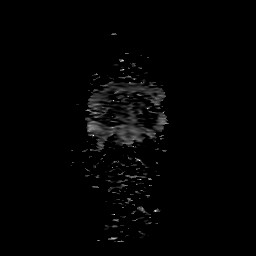

[35 of 48 positions shown; findings below may reference images not displayed]

FINDINGS: Brain: Innumerable punctate infarctions in the right middle cerebral
artery territory affecting the basal ganglia, insular region and
frontoparietal deep white matter. Findings are more likely watershed
rather than embolic as there is no significant cortical component.
Elsewhere, the brainstem and cerebellum are normal. There is old
small vessel infarction in the left thalamus. There are mild small
vessel ischemic changes elsewhere in the cerebral hemispheric white
matter. No mass lesion, hemorrhage, hydrocephalus or extra-axial
collection.

Vascular: Major vessels at the base of the brain show flow.

Skull and upper cervical spine: Negative

Sinuses/Orbits: Clear/normal

Other: None
IMPRESSION: Multiple punctate acute infarctions in the deep brain and
subcortical brain in the right basal ganglia, insular and subinsular
region and right frontoparietal region, most consistent with
watershed infarctions, given the relative absence of cortical
infarction. No evidence of hemorrhage or swelling.

## 2019-09-06 IMAGING — CT CT HEAD W/O CM
4 series · 16 of 47 positions shown, 18 images · non-contrast
Comparison: None.

CLINICAL DATA: 89 y/o F; dizziness and stumbling when she walks.
History of TIAs.

EXAM:
CT HEAD WITHOUT CONTRAST
TECHNIQUE: Contiguous axial images were obtained from the base of the skull
through the vertex without intravenous contrast.

[Series 3: head without · axial · non-contrast · 0.43mm/px · z∈[-153,-48]mm · 7 of 29 slices shown, 9 images]
[im 4/29  brain]
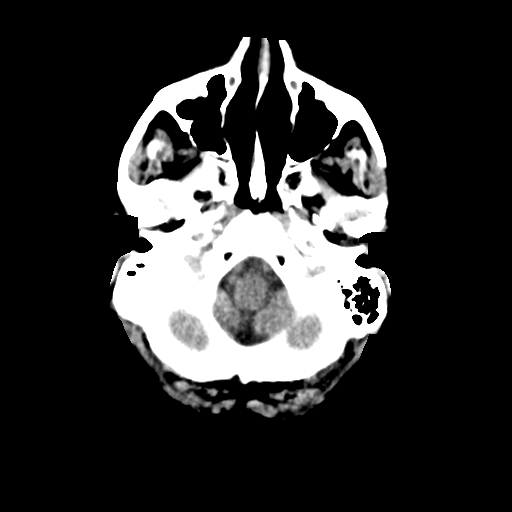
[im 4/29  bone]
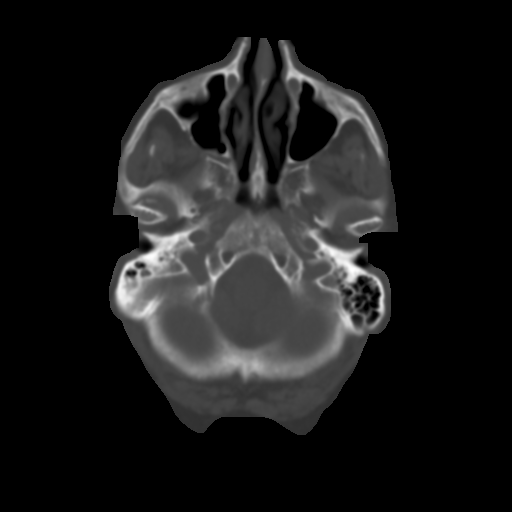
[im 8/29  brain]
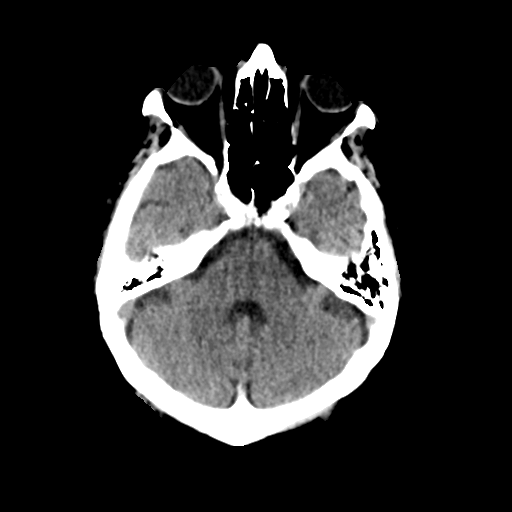
[im 11/29  brain]
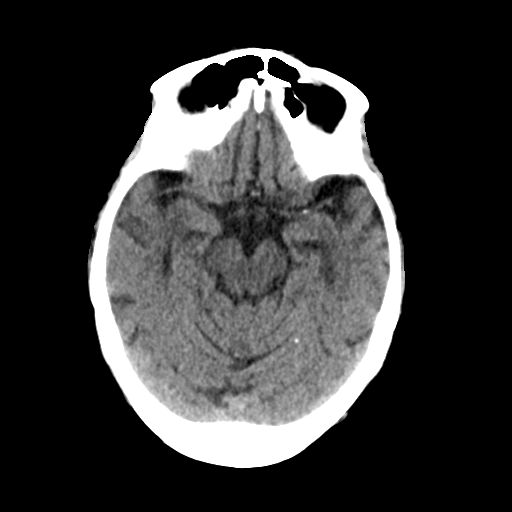
[im 15/29  brain]
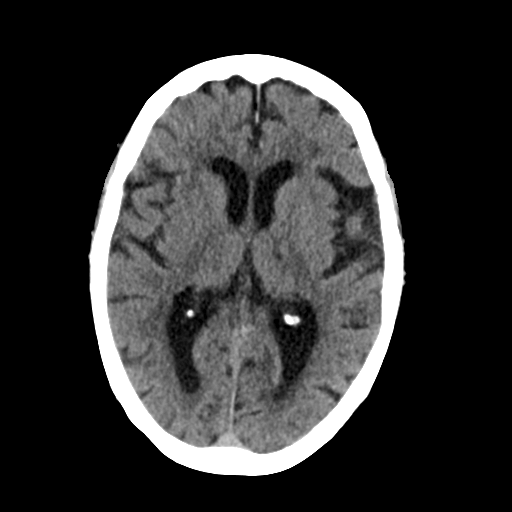
[im 18/29  brain]
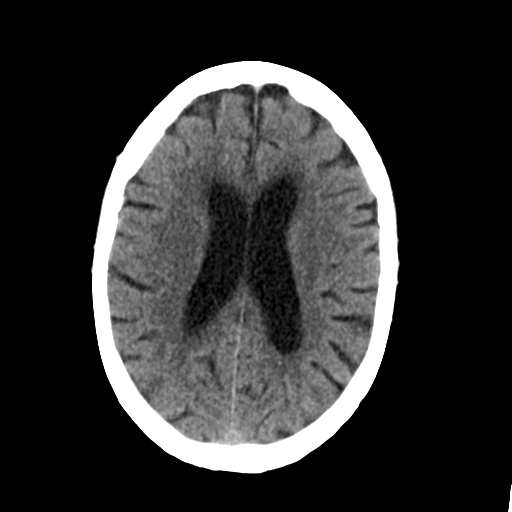
[im 18/29  bone]
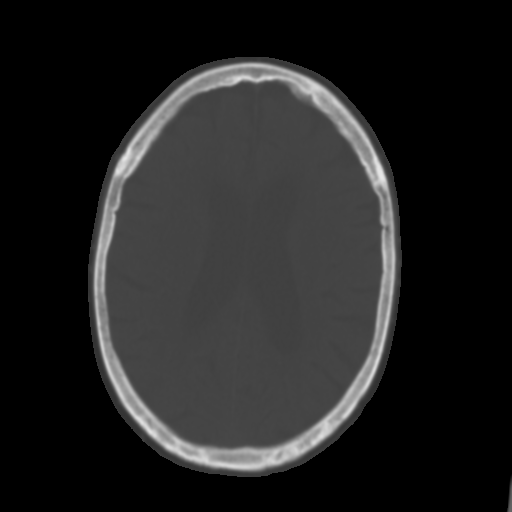
[im 22/29  brain]
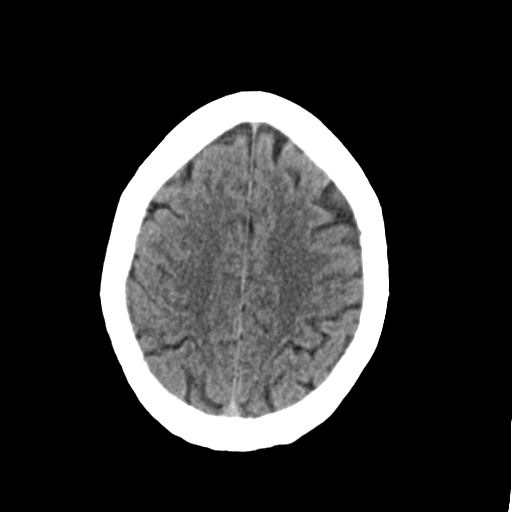
[im 25/29  brain]
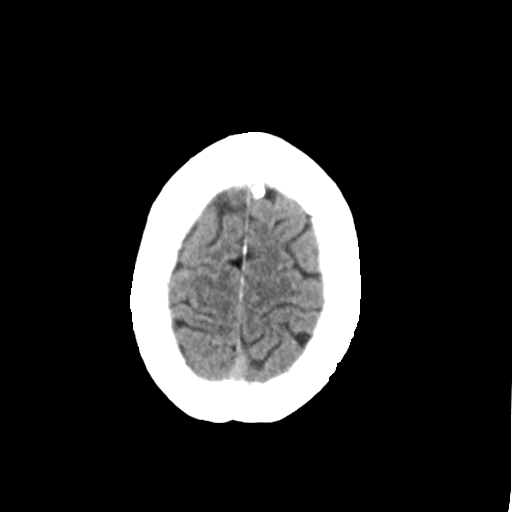

[Series 4: head bone · axial · 0.43mm/px · z∈[-154,-126]mm · 3 of 72 slices shown]
[im 8/72  bone]
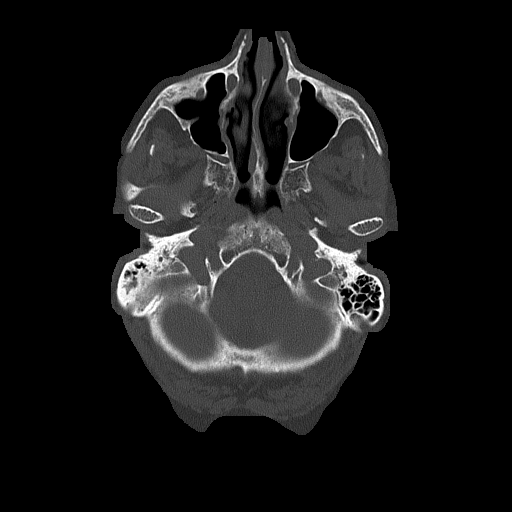
[im 15/72  bone]
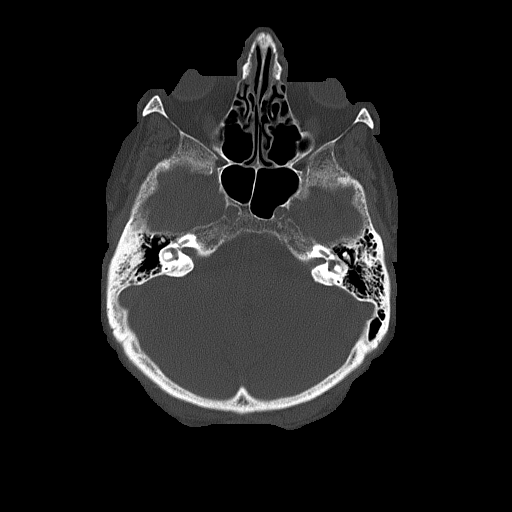
[im 22/72  bone]
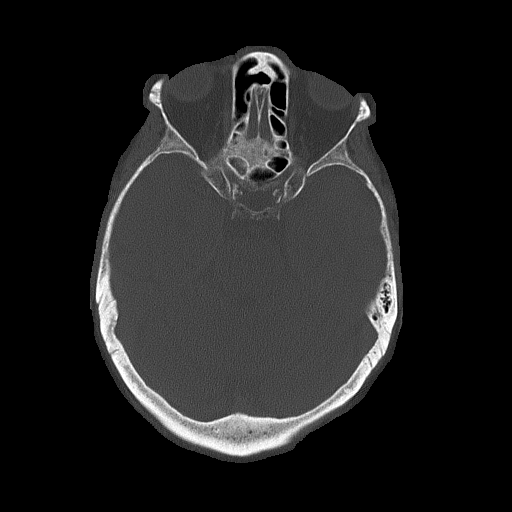

[Series 5: head without cor · coronal · non-contrast · 0.28mm/px · 3 of 67 slices shown]
[im 23/67  brain]
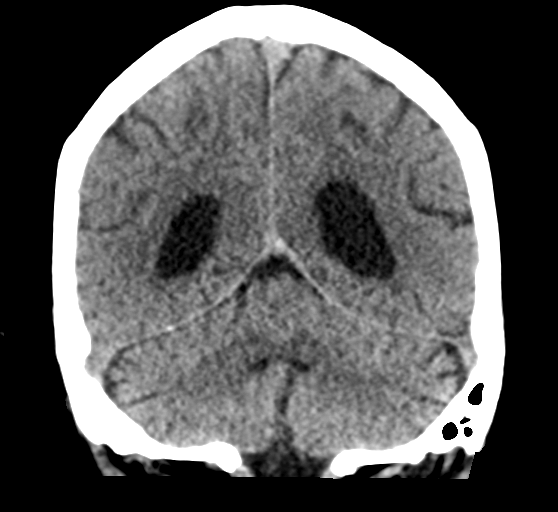
[im 30/67  brain]
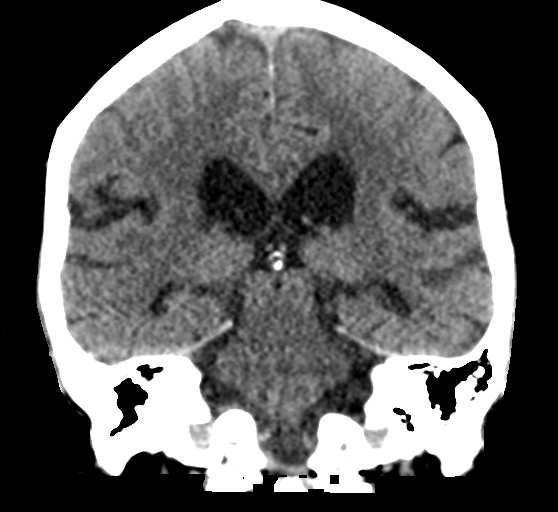
[im 37/67  brain]
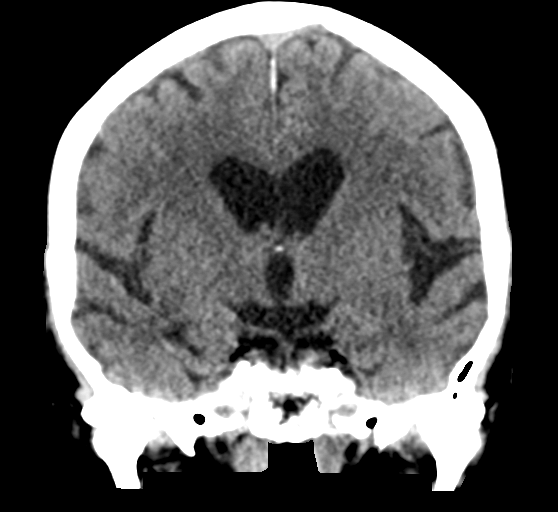

[Series 6: head without sag · sagittal · non-contrast · 0.28mm/px · 3 of 47 slices shown]
[im 16/47  brain]
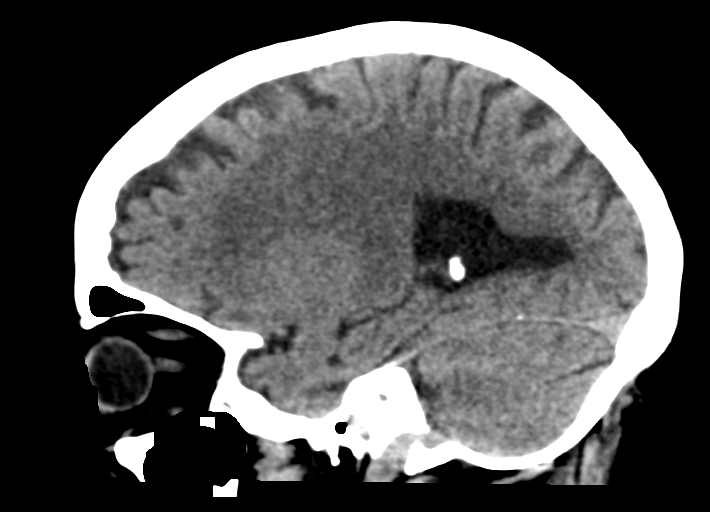
[im 24/47  brain]
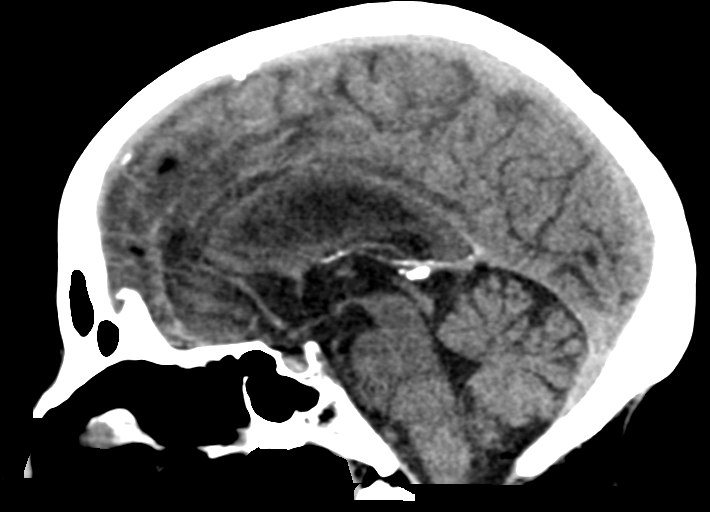
[im 31/47  brain]
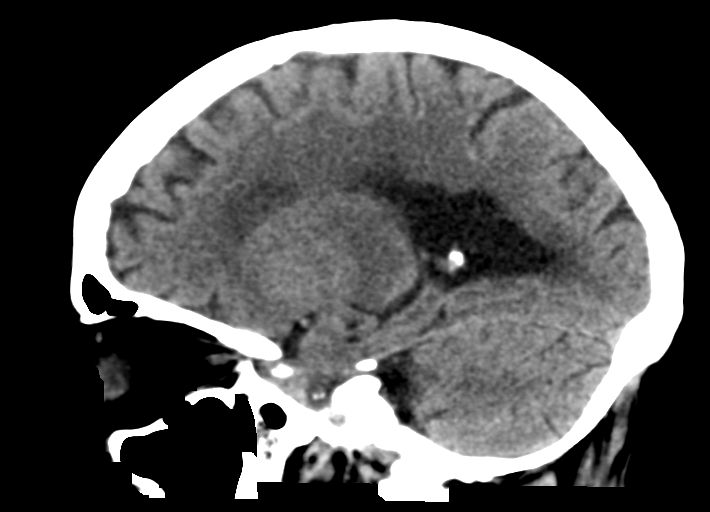

[16 of 47 positions shown; findings below may reference images not displayed]

FINDINGS: Brain: No evidence of acute infarction, hemorrhage, hydrocephalus,
extra-axial collection or mass lesion/mass effect. Mild chronic
microvascular ischemic changes and volume loss of the brain. Small
chronic infarction within the left anterior thalamus.

Vascular: Calcific atherosclerosis of the carotid siphons. No
hyperdense vessel identified.

Skull: Normal. Negative for fracture or focal lesion.

Sinuses/Orbits: No acute finding.

Other: None.
IMPRESSION: 1. No acute intracranial abnormality identified.
2. Mild chronic microvascular ischemic changes and volume loss of
the brain for age. Small chronic infarct within the left anterior
thalamus.

By: Yadi Vale M.D.

## 2019-11-02 ENCOUNTER — Other Ambulatory Visit: Payer: Self-pay

## 2019-11-02 ENCOUNTER — Non-Acute Institutional Stay: Payer: Medicare Other | Admitting: Adult Health Nurse Practitioner

## 2019-11-02 DIAGNOSIS — Z515 Encounter for palliative care: Secondary | ICD-10-CM

## 2019-11-02 DIAGNOSIS — F0390 Unspecified dementia without behavioral disturbance: Secondary | ICD-10-CM

## 2019-11-02 NOTE — Progress Notes (Signed)
Therapist, nutritional Palliative Care Consult Note Telephone: [phone]  Fax: [phone]  PATIENT NAME: Katelyn Mckinney DOB: 1926-01-10 MRN: [id number]  PRIMARY CARE PROVIDER:   Dr. Bernette Redbird  REFERRING PROVIDER:  Dr. Bernette Redbird  RESPONSIBLE PARTY:   Felipa Evener, daughter H: [phone] C: [phone]    RECOMMENDATIONS and PLAN:  1.  Advanced care planning.  Patient is a DNR  2.  Dementia.  FAST 7b.  Patient is bedbound and requires total care even for feeding.  She is incontinent of B&B.  She is able to sit up in chair with lift for transfer.  She had Covid infection in November 2020 and again in February 2021.  She is O2 dependent at 2L after having COVID.  Staff does report that she used to be able to transfer with sit to stand lift prior to COVID and now needs hoyer lift for transfers.  Patient has left sided weakness related to prior CVA and wears a splint to left arm. She requires assistance with feeding and eats up to 50% of her meals and nutritional supplements.  Weight was 137 pounds in January 2020 and is now 112 pounds.  Discussed hospice with daughter.  She would like hospice services is she is appropriate. She is appreciative of all the care her mother is getting.  Patient has had a quicker decline after having COVID but staff do feel as if she may be starting to stabilize.  Have reached out to hospice physicians for hospice eligibility and will reach back out to daughter and facility once I hear back from them.  If not hospice appropriate at this time will continue to monitor patient every 2 weeks for symptom management/decline and make recommendations as needed.  I spent 60 minutes providing this consultation,  from 10:00 to 11:00. More than 50% of the time in this consultation was spent coordinating communication.   HISTORY OF PRESENT ILLNESS:  Katelyn Mckinney is a 86 y.o. year old female with multiple medical problems including  dementia, a-fib, HTN, HLD, arthritis, depression, anxiety, O2 dependent after COVID infection. Palliative Care was asked to help address goals of care.   CODE STATUS: DNR  PPS: 30% HOSPICE ELIGIBILITY/DIAGNOSIS: TBD  PHYSICAL EXAM:  O2 99% on 2L.  HR 64  weight General: NAD, frail appearing, thin Cardiovascular: regular rate and rhythm Pulmonary: lung sounds diminished; normal respiratory effort Abdomen: soft, nontender, + bowel sounds GU: no suprapubic tenderness Extremities: no edema, has splint to left arm Skin: no rashes on exposed skin Neurological: Weakness; left sided weakness due to previous CVA; A&O to person and place   PAST MEDICAL HISTORY:  Past Medical History:  Diagnosis Date  . Atrial fibrillation ([facility]) 04/22/2018  . CVA (cerebral vascular accident) ([facility])   . Dementia ([facility])   . Diverticulitis   . GERD (gastroesophageal reflux disease)   . Hyperlipidemia   . Hypertension     SOCIAL HX:  Social History   Tobacco Use  . Smoking status: Former Games developer  . Smokeless tobacco: Never Used  . Tobacco comment: LIGHT SMOKER OVER 30 YEARS AGO  Substance Use Topics  . Alcohol use: Never    ALLERGIES:  Allergies  Allergen Reactions  . Other Other (See Comments)    Unknown reactions to [employer]  . Latex Rash     PERTINENT MEDICATIONS:  Outpatient Encounter Medications as of 11/02/2019  Medication Sig  . acetaminophen (TYLENOL) 325 MG tablet Take 2 tablets (650 mg total)  by mouth at bedtime. (Patient taking differently: Take 650 mg by mouth 3 (three) times daily. )  . apixaban (ELIQUIS) 5 MG TABS tablet Take 1 tablet (5 mg total) by mouth 2 (two) times daily.  Marland Kitchen atorvastatin (LIPITOR) 10 MG tablet Take 10 mg by mouth every evening.   . cephALEXin (KEFLEX) 500 MG capsule Take 1 capsule (500 mg total) by mouth 4 (four) times daily. (Patient not taking: Reported on 06/26/2019)  . divalproex (DEPAKOTE) 125 MG DR tablet Take 1 tablet (125 mg total) by mouth daily.  (Patient not taking: Reported on 06/26/2019)  . docusate (COLACE) 50 MG/5ML liquid Take 100 mg by mouth 2 (two) times daily.   Marland Kitchen escitalopram (LEXAPRO) 10 MG tablet Take 10 mg by mouth daily. Take with 5mg  tablet to equal 15 mg daily  . escitalopram (LEXAPRO) 5 MG tablet Take 5 mg by mouth daily. Take with 10mg  tablet to equal 15mg  daily  . ezetimibe (ZETIA) 10 MG tablet Take 1 tablet (10 mg total) by mouth daily. (Patient not taking: Reported on 06/26/2019)  . famotidine (PEPCID) 20 MG tablet Take 20 mg by mouth 2 (two) times daily.  . fluticasone (FLONASE) 50 MCG/ACT nasal spray Place 2 sprays into both nostrils every evening.  Marland Kitchen LORazepam (ATIVAN) 0.5 MG tablet Take 0.5 mg by mouth at bedtime.  . memantine (NAMENDA) 10 MG tablet Take 10 mg by mouth 2 (two) times daily.  . metoprolol tartrate (LOPRESSOR) 25 MG tablet Take 0.5 tablets (12.5 mg total) by mouth 2 (two) times daily. (Patient taking differently: Take 12.5 mg by mouth 2 (two) times daily. Hold medication if systolic blood pressure <818, diastolic blood pressure <29, or heart rate <60.)  . polyethylene glycol (MIRALAX / GLYCOLAX) packet Take 17 g by mouth every other day.   No facility-administered encounter medications on file as of 11/02/2019.     Tristan Proto Jenetta Downer, NP

## 2019-11-03 ENCOUNTER — Telehealth: Payer: Self-pay | Admitting: Adult Health Nurse Practitioner

## 2019-11-03 NOTE — Telephone Encounter (Signed)
Spoke with daughter about patient being hospice eligible.  She is in agreement with starting hospice services at the facility.  Spoke with Dr. Collier Bullock to get referral for hospice and she is going to put the order in [facility] for the facility. Oluwadarasimi Favor K. Garner Nash NP

## 2020-11-23 DEATH — deceased
